# Patient Record
Sex: Female | Born: 1995 | Race: White | Hispanic: No | Marital: Married | Smoking: Former smoker
Health system: Southern US, Community
[De-identification: ages and names within clinical notes are randomized; demographics above are authoritative.]

## PROBLEM LIST (undated history)

## (undated) DIAGNOSIS — F419 Anxiety disorder, unspecified: Secondary | ICD-10-CM

## (undated) DIAGNOSIS — F329 Major depressive disorder, single episode, unspecified: Secondary | ICD-10-CM

## (undated) DIAGNOSIS — F32A Depression, unspecified: Secondary | ICD-10-CM

## (undated) HISTORY — PX: MYRINGOTOMY WITH TUBE PLACEMENT: SHX5663

## (undated) HISTORY — PX: TONSILLECTOMY: SUR1361

## (undated) HISTORY — PX: ADENOIDECTOMY: SUR15

---
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
## 2013-04-05 ENCOUNTER — Emergency Department: Payer: Self-pay | Admitting: Internal Medicine

## 2014-04-09 ENCOUNTER — Emergency Department: Payer: Self-pay | Admitting: Emergency Medicine

## 2014-07-06 ENCOUNTER — Emergency Department: Payer: Self-pay | Admitting: Emergency Medicine

## 2014-07-06 LAB — CBC
HCT: 45 % (ref 35.0–47.0)
HGB: 15.3 g/dL (ref 12.0–16.0)
MCH: 32.5 pg (ref 26.0–34.0)
MCHC: 34.1 g/dL (ref 32.0–36.0)
MCV: 96 fL (ref 80–100)
PLATELETS: 236 10*3/uL (ref 150–440)
RBC: 4.71 10*6/uL (ref 3.80–5.20)
RDW: 12.4 % (ref 11.5–14.5)
WBC: 8.8 10*3/uL (ref 3.6–11.0)

## 2014-07-06 LAB — COMPREHENSIVE METABOLIC PANEL
ALT: 29 U/L
AST: 28 U/L (ref 15–37)
Albumin: 4.6 g/dL (ref 3.4–5.0)
Alkaline Phosphatase: 62 U/L
Anion Gap: 10 (ref 7–16)
BUN: 9 mg/dL (ref 7–18)
Bilirubin,Total: 0.7 mg/dL (ref 0.2–1.0)
CALCIUM: 9.5 mg/dL (ref 8.5–10.1)
Chloride: 107 mmol/L (ref 98–107)
Co2: 23 mmol/L (ref 21–32)
Creatinine: 0.87 mg/dL (ref 0.60–1.30)
EGFR (African American): >60
EGFR (Non-African Amer.): >60
Glucose: 102 mg/dL — ABNORMAL HIGH (ref 65–99)
Osmolality: 278 (ref 275–301)
Potassium: 3.9 mmol/L (ref 3.5–5.1)
Sodium: 140 mmol/L (ref 136–145)
Total Protein: 8.2 g/dL (ref 6.4–8.2)

## 2014-07-06 LAB — URINALYSIS, COMPLETE
Bacteria: NONE SEEN
Bilirubin,UR: NEGATIVE
GLUCOSE, UR: NEGATIVE mg/dL (ref 0–75)
Nitrite: NEGATIVE
Ph: 6 (ref 4.5–8.0)
Protein: 30
RBC,UR: 10 /HPF (ref 0–5)
Specific Gravity: 1.028 (ref 1.003–1.030)
Squamous Epithelial: 11
WBC UR: 8 /HPF (ref 0–5)

## 2016-07-06 ENCOUNTER — Emergency Department
Admission: EM | Admit: 2016-07-06 | Discharge: 2016-07-06 | Disposition: A | Discharge status: Home / Self Care | Payer: Self-pay | Attending: Emergency Medicine | Admitting: Emergency Medicine

## 2016-07-06 ENCOUNTER — Encounter: Payer: Self-pay | Admitting: Emergency Medicine

## 2016-07-06 DIAGNOSIS — R112 Nausea with vomiting, unspecified: Secondary | ICD-10-CM | POA: Insufficient documentation

## 2016-07-06 DIAGNOSIS — Z79899 Other long term (current) drug therapy: Secondary | ICD-10-CM | POA: Insufficient documentation

## 2016-07-06 DIAGNOSIS — F17200 Nicotine dependence, unspecified, uncomplicated: Secondary | ICD-10-CM | POA: Insufficient documentation

## 2016-07-06 LAB — URINALYSIS, COMPLETE (UACMP) WITH MICROSCOPIC
BILIRUBIN URINE: NEGATIVE
Glucose, UA: NEGATIVE mg/dL
KETONES UR: 80 mg/dL — AB
Nitrite: NEGATIVE
Protein, ur: 30 mg/dL — AB
SPECIFIC GRAVITY, URINE: 1.029 (ref 1.005–1.030)
pH: 6 (ref 5.0–8.0)

## 2016-07-06 LAB — COMPREHENSIVE METABOLIC PANEL
ALT: 14 U/L (ref 14–54)
AST: 20 U/L (ref 15–41)
Albumin: 5.2 g/dL — ABNORMAL HIGH (ref 3.5–5.0)
Alkaline Phosphatase: 48 U/L (ref 38–126)
Anion gap: 10 (ref 5–15)
BUN: 15 mg/dL (ref 6–20)
CHLORIDE: 107 mmol/L (ref 101–111)
CO2: 21 mmol/L — AB (ref 22–32)
Calcium: 9.7 mg/dL (ref 8.9–10.3)
Creatinine, Ser: 0.95 mg/dL (ref 0.44–1.00)
Glucose, Bld: 101 mg/dL — ABNORMAL HIGH (ref 65–99)
Potassium: 3.6 mmol/L (ref 3.5–5.1)
SODIUM: 138 mmol/L (ref 135–145)
Total Bilirubin: 0.6 mg/dL (ref 0.3–1.2)
Total Protein: 8.2 g/dL — ABNORMAL HIGH (ref 6.5–8.1)

## 2016-07-06 LAB — CBC
HEMATOCRIT: 42.4 % (ref 35.0–47.0)
Hemoglobin: 14.5 g/dL (ref 12.0–16.0)
MCH: 32.3 pg (ref 26.0–34.0)
MCHC: 34.3 g/dL (ref 32.0–36.0)
MCV: 94.1 fL (ref 80.0–100.0)
PLATELETS: 275 10*3/uL (ref 150–440)
RBC: 4.5 MIL/uL (ref 3.80–5.20)
RDW: 12.5 % (ref 11.5–14.5)
WBC: 8.3 10*3/uL (ref 3.6–11.0)

## 2016-07-06 LAB — POCT PREGNANCY, URINE: PREG TEST UR: NEGATIVE

## 2016-07-06 LAB — LIPASE, BLOOD: LIPASE: 16 U/L (ref 11–51)

## 2016-07-06 MED ORDER — PROMETHAZINE HCL 25 MG/ML IJ SOLN
6.2500 mg | Freq: Once | INTRAMUSCULAR | Status: AC
  Administered 2016-07-06: 6.25 mg via INTRAVENOUS
  Filled 2016-07-06: qty 1

## 2016-07-06 MED ORDER — PROMETHAZINE HCL 12.5 MG RE SUPP: 12.5000 mg | Freq: Four times a day (QID) | RECTAL | 0 refills | Status: DC | PRN

## 2016-07-06 MED ORDER — SODIUM CHLORIDE 0.9 % IV BOLUS (SEPSIS)
1000.0000 mL | Freq: Once | INTRAVENOUS | Status: AC
  Administered 2016-07-06: 1000 mL via INTRAVENOUS

## 2016-07-06 MED ORDER — PROMETHAZINE HCL 12.5 MG PO TABS: 12.5000 mg | ORAL_TABLET | Freq: Four times a day (QID) | ORAL | 0 refills | Status: DC | PRN

## 2016-07-06 NOTE — ED Notes (Signed)
ED Provider at bedside. 

## 2016-07-06 NOTE — Discharge Instructions (Signed)
Please advance that as tolerated and drink plenty of fluids. Return here especially for persistent localized abdominal pain, fever, bloody stool.  Please return immediately if condition worsens. Please contact her primary physician or the physician you were given for referral. If you have any specialist physicians involved in her treatment and plan please also contact them. Thank you for using [facility].

## 2016-07-06 NOTE — ED Provider Notes (Signed)
Time Seen: Approximately 1256  I have reviewed the triage notes  Chief Complaint: Emesis   History of Present Illness: Sydney HeathSydney L Clark is a 20 y.o. female who presents with some persistent intermittent vomiting for the past week. She's had some small amount of loose stool with no melena or hematochezia. Patient states she has a history of anxiety and felt this may be an exacerbation because of it. The patient denies any melena or hematochezia. She does admit to daily marijuana usage. She states she's never had cyclic vomiting from marijuana in the past. She denies any focal abdominal pain. She denies any fever food exposure or any other concerns   No past medical history on file.  There are no active problems to display for this patient.   Past Surgical History:  Procedure Laterality Date  . ADENOIDECTOMY    . TONSILLECTOMY      Past Surgical History:  Procedure Laterality Date  . ADENOIDECTOMY    . TONSILLECTOMY      Current Outpatient Rx  . Order #: [id number] Class: Historical Med  . Order #: [id number] Class: Historical Med  . Order #: [id number] Class: Historical Med  . Order #: [id number] Class: Print  . Order #: [id number] Class: Print    Allergies:  Patient has no known allergies.  Family History: No family history on file.  Social History: Social History  Substance Use Topics  . Smoking status: Current Every Day Smoker  . Smokeless tobacco: Never Used  . Alcohol use No     Review of Systems:   10 point review of systems was performed and was otherwise negative:  Constitutional: No fever Eyes: No visual disturbances ENT: No sore throat, ear pain Cardiac: No chest pain Respiratory: No shortness of breath, wheezing, or stridor Abdomen: No abdominal pain, n, NoSignificant diarrhea with just loose stoolEndocrine: No weight loss, No night sweats Extremities: No peripheral edema, cyanosis Skin: No rashes, easy bruising Neurologic: No focal weakness, trouble  with speech or swollowing Urologic: No dysuria, Hematuria, or urinary frequency   Physical Exam:  ED Triage Vitals [07/06/16 1237]  Enc Vitals Group     BP 129/80     Pulse Rate (!) 104     Resp 18     Temp 98.7 F (37.1 C)     Temp Source Oral     SpO2 100 %     Weight 171 lb (77.6 kg)     Height 5\' 6"  (1.676 m)     Head Circumference      Peak Flow      Pain Score      Pain Loc      Pain Edu?      Excl. in GC?     General: Awake , Alert , and Oriented times 3; GCS 15 Head: Normal cephalic , atraumatic Eyes: Pupils equal , round, reactive to light Nose/Throat: No nasal drainage, patent upper airway without erythema or exudate.  Neck: Supple, Full range of motion, No anterior adenopathy or palpable thyroid masses Lungs: Clear to ascultation without wheezes , rhonchi, or rales Heart: Regular rate, regular rhythm without murmurs , gallops , or rubs Abdomen: Soft, non tender without rebound, guarding , or rigidity; bowel sounds positive and symmetric in all 4 quadrants. No organomegaly .        Extremities: 2 plus symmetric pulses. No edema, clubbing or cyanosis Neurologic: normal ambulation, Motor symmetric without deficits, sensory intact Skin: warm, dry, no rashes   Labs:   All laboratory work  was reviewed including any pertinent negatives or positives listed below:  Labs Reviewed  COMPREHENSIVE METABOLIC PANEL - Abnormal; Notable for the following:       Result Value   CO2 21 (*)    Glucose, Bld 101 (*)    Total Protein 8.2 (*)    Albumin 5.2 (*)    All other components within normal limits  URINALYSIS, COMPLETE (UACMP) WITH MICROSCOPIC - Abnormal; Notable for the following:    Color, Urine YELLOW (*)    APPearance HAZY (*)    Hgb urine dipstick SMALL (*)    Ketones, ur 80 (*)    Protein, ur 30 (*)    Leukocytes, UA SMALL (*)    Bacteria, UA RARE (*)    Squamous Epithelial / LPF 6-30 (*)    All other components within normal limits  LIPASE, BLOOD  CBC   POC URINE PREG, ED  POCT PREGNANCY, URINE  Patient has concentrated urine but otherwise no significant findings   ED Course: * Patient had an IV established and was given an IV fluid bolus with IV Phenergan. She was able to maintain at a fluid and food here in emergency department. I felt she may have a viral gastroenteritis or possibly marijuana-induced cyclic vomiting. Patient appears to be of understanding and does not present with any abdominal pain and I felt this was unlikely to be a surgical abdomen. Clinical Course      Assessment: * Nausea and vomiting   Final Clinical Impression:  Final diagnoses:  Non-intractable vomiting with nausea, unspecified vomiting type     Plan:  Outpatient " Discharge Medication List as of 07/06/2016  2:44 PM    START taking these medications   Details  promethazine (PHENERGAN) 12.5 MG suppository Place 1 suppository (12.5 mg total) rectally every 6 (six) hours as needed for nausea or vomiting., Starting Sun 07/06/2016, Print    promethazine (PHENERGAN) 12.5 MG tablet Take 1 tablet (12.5 mg total) by mouth every 6 (six) hours as needed for nausea or vomiting., Starting Sun 07/06/2016, Print      " Patient was advised to return immediately if condition worsens. Patient was advised to follow up with their primary care physician or other specialized physicians involved in their outpatient care. The patient and/or family member/power of attorney had laboratory results reviewed at the bedside. All questions and concerns were addressed and appropriate discharge instructions were distributed by the nursing staff.             Jennye MoccasinBrian S Cash Duce, MD 07/06/16 [phone]

## 2016-07-06 NOTE — ED Triage Notes (Signed)
Pt c/o vomiting over last week. Stated Monday when was planning to move but started having anxiety so has not moved yet but has still had vomiting and reports unable to keep food/liquids down. Denies blood in vomit/stool. Denies fevers.

## 2016-07-26 ENCOUNTER — Encounter: Payer: Self-pay | Admitting: Emergency Medicine

## 2016-07-26 ENCOUNTER — Emergency Department
Admission: EM | Admit: 2016-07-26 | Discharge: 2016-07-26 | Disposition: A | Discharge status: Home / Self Care | Payer: Self-pay | Attending: Emergency Medicine | Admitting: Emergency Medicine

## 2016-07-26 DIAGNOSIS — F17210 Nicotine dependence, cigarettes, uncomplicated: Secondary | ICD-10-CM | POA: Insufficient documentation

## 2016-07-26 DIAGNOSIS — Y929 Unspecified place or not applicable: Secondary | ICD-10-CM | POA: Insufficient documentation

## 2016-07-26 DIAGNOSIS — X58XXXA Exposure to other specified factors, initial encounter: Secondary | ICD-10-CM | POA: Insufficient documentation

## 2016-07-26 DIAGNOSIS — Y939 Activity, unspecified: Secondary | ICD-10-CM | POA: Insufficient documentation

## 2016-07-26 DIAGNOSIS — S39012A Strain of muscle, fascia and tendon of lower back, initial encounter: Secondary | ICD-10-CM | POA: Insufficient documentation

## 2016-07-26 DIAGNOSIS — Y999 Unspecified external cause status: Secondary | ICD-10-CM | POA: Insufficient documentation

## 2016-07-26 MED ORDER — DIAZEPAM 5 MG PO TABS
5.0000 mg | ORAL_TABLET | Freq: Once | ORAL | Status: AC
  Administered 2016-07-26: 5 mg via ORAL
  Filled 2016-07-26: qty 1

## 2016-07-26 NOTE — ED Triage Notes (Addendum)
Patient ambulatory to triage. Patient states that she was coughing and felt something "pop".  Patient states that since then she has had left lower back pain and right leg heaviness. Patient states that it started in the afternoon but the pain has become worse.

## 2016-07-26 NOTE — ED Notes (Signed)
Dr Kinner at bedside. 

## 2016-07-26 NOTE — ED Notes (Signed)
Pt verbalized understanding of discharge instructions. NAD at this time. 

## 2016-07-26 NOTE — ED Provider Notes (Signed)
[facility] Emergency Department Provider Note   ____________________________________________    I have reviewed the triage vital signs and the nursing notes.   HISTORY  Chief Complaint Back Pain     HPI Sydney Clark is a 21 y.o. female who presents with complaints of low back pain. Patient reports yesterday at 1 PM she was coughing and suddenly felt a pain in her back. She denies neuro deficits. She denies radiation of pain. No IV drug abuse. She reports the pain is sharp and it is painful to flex and move. She is able to walk but it is painful. She is concerned that she may have slipped a disc   History reviewed. No pertinent past medical history.  There are no active problems to display for this patient.   Past Surgical History:  Procedure Laterality Date  . ADENOIDECTOMY    . TONSILLECTOMY      Prior to Admission medications   Medication Sig Start Date End Date Taking? Authorizing Provider  busPIRone (BUSPAR) 10 MG tablet Take 20 mg by mouth 2 (two) times daily.   Yes Historical Provider, MD  lamoTRIgine (LAMICTAL) 100 MG tablet Take 100 mg by mouth daily.   Yes Historical Provider, MD  PARoxetine (PAXIL) 20 MG tablet Take 20 mg by mouth daily.   Yes Historical Provider, MD  promethazine (PHENERGAN) 12.5 MG suppository Place 1 suppository (12.5 mg total) rectally every 6 (six) hours as needed for nausea or vomiting. 07/06/16   Jennye MoccasinBrian S Quigley, MD  promethazine (PHENERGAN) 12.5 MG tablet Take 1 tablet (12.5 mg total) by mouth every 6 (six) hours as needed for nausea or vomiting. 07/06/16   Jennye MoccasinBrian S Quigley, MD     Allergies Patient has no known allergies.  No family history on file.  Social History Social History  Substance Use Topics  . Smoking status: Current Every Day Smoker  . Smokeless tobacco: Never Used  . Alcohol use No    Review of Systems  Constitutional: No fever/chills     Gastrointestinal: No abdominal pain.   No nausea, no vomiting.   Genitourinary: Negative forUrinary incontinence Musculoskeletal: As above Skin: Negative for rash. Neurological: Negative for focal deficits    ____________________________________________   PHYSICAL EXAM:  VITAL SIGNS: ED Triage Vitals  Enc Vitals Group     BP 07/26/16 0500 113/68     Pulse Rate 07/26/16 0500 (!) 101     Resp 07/26/16 0500 18     Temp 07/26/16 0500 97.8 F (36.6 C)     Temp Source 07/26/16 0500 Oral     SpO2 07/26/16 0500 98 %     Weight 07/26/16 0457 171 lb (77.6 kg)     Height 07/26/16 0457 5\' 6"  (1.676 m)     Head Circumference --      Peak Flow --      Pain Score 07/26/16 0457 8     Pain Loc --      Pain Edu? --      Excl. in GC? --     Constitutional: Alert and oriented. No acute distress. Pleasant and interactive Eyes: Conjunctivae are normal.   Nose: No congestion/rhinnorhea. Mouth/Throat: Mucous membranes are moist.   Cardiovascular: Normal rate, regular rhythm.  Respiratory: Normal respiratory effort.  No retractions. Genitourinary: deferred Musculoskeletal:Normal strength in the lower extremities. 2+ distal pulses. Back no vertebral tenderness to palpation. No palpable muscle spasms Neurologic:  Normal speech and language. No gross focal neurologic deficits are  appreciated.   Skin:  Skin is warm, dry and intact. No rash noted.   ____________________________________________   LABS (all labs ordered are listed, but only abnormal results are displayed)  Labs Reviewed - No data to display ____________________________________________  EKG   ____________________________________________  RADIOLOGY  None ____________________________________________   PROCEDURES  Procedure(s) performed: No    Critical Care performed: No ____________________________________________   INITIAL IMPRESSION / ASSESSMENT AND PLAN / ED COURSE  Pertinent labs & imaging results that were available during my care of the  patient were reviewed by me and considered in my medical decision making (see chart for details).  She presents with low back pain. Suspect lumbar strain. No concerning symptoms Recommend supportive care. Outpatient follow-up as needed we discussed return precautions.   ____________________________________________   FINAL CLINICAL IMPRESSION(S) / ED DIAGNOSES  Final diagnoses:  Strain of lumbar region, initial encounter      NEW MEDICATIONS STARTED DURING THIS VISIT:  New Prescriptions   No medications on file     Note:  This document was prepared using Dragon voice recognition software and may include unintentional dictation errors.    [name], MD 07/26/16 [phone]

## 2016-08-16 ENCOUNTER — Emergency Department
Admission: EM | Admit: 2016-08-16 | Discharge: 2016-08-16 | Disposition: A | Discharge status: Home / Self Care | Payer: Self-pay | Attending: Emergency Medicine | Admitting: Emergency Medicine

## 2016-08-16 ENCOUNTER — Encounter: Payer: Self-pay | Admitting: Emergency Medicine

## 2016-08-16 DIAGNOSIS — F1290 Cannabis use, unspecified, uncomplicated: Secondary | ICD-10-CM | POA: Insufficient documentation

## 2016-08-16 DIAGNOSIS — Z5321 Procedure and treatment not carried out due to patient leaving prior to being seen by health care provider: Secondary | ICD-10-CM | POA: Insufficient documentation

## 2016-08-16 DIAGNOSIS — R05 Cough: Secondary | ICD-10-CM | POA: Insufficient documentation

## 2016-08-16 DIAGNOSIS — F17210 Nicotine dependence, cigarettes, uncomplicated: Secondary | ICD-10-CM | POA: Insufficient documentation

## 2016-08-16 DIAGNOSIS — Z79899 Other long term (current) drug therapy: Secondary | ICD-10-CM | POA: Insufficient documentation

## 2016-08-16 DIAGNOSIS — R197 Diarrhea, unspecified: Secondary | ICD-10-CM | POA: Insufficient documentation

## 2016-08-16 HISTORY — DX: Major depressive disorder, single episode, unspecified: F32.9

## 2016-08-16 HISTORY — DX: Depression, unspecified: F32.A

## 2016-08-16 HISTORY — DX: Anxiety disorder, unspecified: F41.9

## 2016-08-16 LAB — COMPREHENSIVE METABOLIC PANEL
ALT: 23 U/L (ref 14–54)
AST: 24 U/L (ref 15–41)
Albumin: 5.1 g/dL — ABNORMAL HIGH (ref 3.5–5.0)
Alkaline Phosphatase: 52 U/L (ref 38–126)
Anion gap: 9 (ref 5–15)
BUN: 13 mg/dL (ref 6–20)
CHLORIDE: 106 mmol/L (ref 101–111)
CO2: 22 mmol/L (ref 22–32)
CREATININE: 0.77 mg/dL (ref 0.44–1.00)
Calcium: 9.4 mg/dL (ref 8.9–10.3)
GFR calc Af Amer: >60 mL/min (ref >60)
GFR calc non Af Amer: >60 mL/min (ref >60)
Glucose, Bld: 92 mg/dL (ref 65–99)
POTASSIUM: 3.7 mmol/L (ref 3.5–5.1)
SODIUM: 137 mmol/L (ref 135–145)
Total Bilirubin: 0.6 mg/dL (ref 0.3–1.2)
Total Protein: 8.3 g/dL — ABNORMAL HIGH (ref 6.5–8.1)

## 2016-08-16 LAB — URINALYSIS, COMPLETE (UACMP) WITH MICROSCOPIC
BILIRUBIN URINE: NEGATIVE
Glucose, UA: NEGATIVE mg/dL
Hgb urine dipstick: NEGATIVE
KETONES UR: 20 mg/dL — AB
LEUKOCYTES UA: NEGATIVE
Nitrite: NEGATIVE
PH: 6 (ref 5.0–8.0)
Protein, ur: 30 mg/dL — AB
Specific Gravity, Urine: 1.027 (ref 1.005–1.030)

## 2016-08-16 LAB — POCT PREGNANCY, URINE: PREG TEST UR: NEGATIVE

## 2016-08-16 LAB — LIPASE, BLOOD: LIPASE: 21 U/L (ref 11–51)

## 2016-08-16 LAB — CBC
HEMATOCRIT: 40.1 % (ref 35.0–47.0)
Hemoglobin: 13.9 g/dL (ref 12.0–16.0)
MCH: 31.6 pg (ref 26.0–34.0)
MCHC: 34.6 g/dL (ref 32.0–36.0)
MCV: 91.2 fL (ref 80.0–100.0)
PLATELETS: 241 10*3/uL (ref 150–440)
RBC: 4.39 MIL/uL (ref 3.80–5.20)
RDW: 13.5 % (ref 11.5–14.5)
WBC: 7.3 10*3/uL (ref 3.6–11.0)

## 2016-08-16 NOTE — ED Triage Notes (Addendum)
Pt reports watery diarrhea since Wednesday; denies abd pain; pt adds she cries a lot but not sure why; on medication for depression but doesn't currently feel depressed; denies suicidal thoughts; pt says she believes her anxiety is causing her diarrhea

## 2017-09-27 ENCOUNTER — Emergency Department
Admission: EM | Admit: 2017-09-27 | Discharge: 2017-09-27 | Disposition: A | Discharge status: Home / Self Care | Payer: Self-pay | Attending: Emergency Medicine | Admitting: Emergency Medicine

## 2017-09-27 ENCOUNTER — Emergency Department: Payer: Self-pay

## 2017-09-27 ENCOUNTER — Other Ambulatory Visit: Payer: Self-pay

## 2017-09-27 DIAGNOSIS — Y929 Unspecified place or not applicable: Secondary | ICD-10-CM | POA: Insufficient documentation

## 2017-09-27 DIAGNOSIS — Y999 Unspecified external cause status: Secondary | ICD-10-CM | POA: Insufficient documentation

## 2017-09-27 DIAGNOSIS — S62306A Unspecified fracture of fifth metacarpal bone, right hand, initial encounter for closed fracture: Secondary | ICD-10-CM | POA: Insufficient documentation

## 2017-09-27 DIAGNOSIS — Y9389 Activity, other specified: Secondary | ICD-10-CM | POA: Insufficient documentation

## 2017-09-27 DIAGNOSIS — X838XXA Intentional self-harm by other specified means, initial encounter: Secondary | ICD-10-CM | POA: Insufficient documentation

## 2017-09-27 DIAGNOSIS — F17210 Nicotine dependence, cigarettes, uncomplicated: Secondary | ICD-10-CM | POA: Insufficient documentation

## 2017-09-27 DIAGNOSIS — Z79899 Other long term (current) drug therapy: Secondary | ICD-10-CM | POA: Insufficient documentation

## 2017-09-27 MED ORDER — MELOXICAM 15 MG PO TABS: 15.0000 mg | ORAL_TABLET | Freq: Every day | ORAL | 1 refills | Status: AC

## 2017-09-27 NOTE — ED Triage Notes (Signed)
Pt states she injured her right hand 2 weeks ago and is concerned it is fractured..Marland Kitchen

## 2017-09-27 NOTE — ED Provider Notes (Signed)
[facility] Emergency Department Provider Note  ____________________________________________  Time seen: Approximately 5:09 PM  I have reviewed the triage vital signs and the nursing notes.   HISTORY  Chief Complaint Hand Pain    HPI Sydney Clark is a 22 y.o. female presents to the emergency department with right fifth metacarpal pain for the past 2 weeks after patient reports that she intentionally struck a 2 x 4.  Patient reports that she did not seek care due to a lack of insurance.  Patient reports that pain has subsided and her range of motion has improved and she denies weakness or changes in sensation of the upper extremities.   Past Medical History:  Diagnosis Date  . Anxiety   . Depression     There are no active problems to display for this patient.   Past Surgical History:  Procedure Laterality Date  . ADENOIDECTOMY    . MYRINGOTOMY WITH TUBE PLACEMENT    . TONSILLECTOMY      Prior to Admission medications   Medication Sig Start Date End Date Taking? Authorizing Provider  busPIRone (BUSPAR) 10 MG tablet Take 20 mg by mouth 2 (two) times daily.    [provider]  lamoTRIgine (LAMICTAL) 100 MG tablet Take 100 mg by mouth daily.    [provider]  meloxicam (MOBIC) 15 MG tablet Take 1 tablet (15 mg total) by mouth daily for 7 days. 09/27/17 10/04/17  Orvil FeilWoods, Terell Kincy M, PA-C  PARoxetine (PAXIL) 20 MG tablet Take 20 mg by mouth daily.    [provider]  promethazine (PHENERGAN) 12.5 MG suppository Place 1 suppository (12.5 mg total) rectally every 6 (six) hours as needed for nausea or vomiting. 07/06/16   Jennye MoccasinQuigley, Brian S, MD  promethazine (PHENERGAN) 12.5 MG tablet Take 1 tablet (12.5 mg total) by mouth every 6 (six) hours as needed for nausea or vomiting. 07/06/16   Jennye MoccasinQuigley, Brian S, MD    Allergies Patient has no known allergies.  No family history on file.  Social History Social History   Tobacco Use  .  Smoking status: Current Every Day Smoker    Packs/day: 0.50    Types: Cigarettes  . Smokeless tobacco: Never Used  Substance Use Topics  . Alcohol use: No  . Drug use: Yes    Types: Marijuana    Comment: last smoked this am     Review of Systems  Constitutional: No fever/chills Eyes: No visual changes. No discharge ENT: No upper respiratory complaints. Cardiovascular: no chest pain. Respiratory: no cough. No SOB. Musculoskeletal: Patient has right fifth digit pain.  Skin: Negative for rash, abrasions, lacerations, ecchymosis. Neurological: Negative for headaches, focal weakness or numbness.   ____________________________________________   PHYSICAL EXAM:  VITAL SIGNS: ED Triage Vitals  Enc Vitals Group     BP 09/27/17 1330 (!) 104/58     Pulse Rate 09/27/17 1330 74     Resp 09/27/17 1330 16     Temp 09/27/17 1330 98.7 F (37.1 C)     Temp Source 09/27/17 1330 Oral     SpO2 09/27/17 1330 98 %     Weight 09/27/17 1330 170 lb (77.1 kg)     Height 09/27/17 1330 5\' 6"  (1.676 m)     Head Circumference --      Peak Flow --      Pain Score 09/27/17 1314 6     Pain Loc --      Pain Edu? --  Excl. in GC? --      Constitutional: Alert and oriented. Well appearing and in no acute distress. Eyes: Conjunctivae are normal. PERRL. EOMI. Head: Atraumatic. Cardiovascular: Normal rate, regular rhythm. Normal S1 and S2.  Good peripheral circulation. Respiratory: Normal respiratory effort without tachypnea or retractions. Lungs CTAB. Good air entry to the bases with no decreased or absent breath sounds. Musculoskeletal: Patient is able to move all 5 right fingers.  Patient has tenderness elicited with palpation over the right fifth metacarpal.  Palpable radial pulse, right. Neurologic:  Normal speech and language. No gross focal neurologic deficits are appreciated.  Skin:  Skin is warm, dry and intact. No rash noted.   ____________________________________________    LABS (all labs ordered are listed, but only abnormal results are displayed)  Labs Reviewed - No data to display ____________________________________________  EKG   ____________________________________________  RADIOLOGY Geraldo Pitter, personally viewed and evaluated these images (plain radiographs) as part of my medical decision making, as well as reviewing the written report by the radiologist.  Dg Hand Complete Right  Result Date: 09/27/2017 CLINICAL DATA:  Pt states she punched a wall 2 weeks ago pain and bruising 5th metacarpal. shielded EXAM: RIGHT HAND - COMPLETE 3+ VIEW COMPARISON:  None. FINDINGS: Displaced fracture within the distal fifth metacarpal bone, with associated angulation deformity. No other osseous fracture or dislocation. Soft tissues about the right hand are unremarkable. IMPRESSION: Displaced fracture within the distal portion of the fifth metacarpal bone, centered at the base of the metacarpal head, with associated angulation deformity approaching 60 degrees. Alignment at the overlying fifth MCP joint remains normal. Electronically Signed   By: Bary Richard M.D.   On: 09/27/2017 13:58    ____________________________________________    PROCEDURES  Procedure(s) performed:    Procedures    Medications - No data to display   ____________________________________________   INITIAL IMPRESSION / ASSESSMENT AND PLAN / ED COURSE  Pertinent labs & imaging results that were available during my care of the patient were reviewed by me and considered in my medical decision making (see chart for details).  Review of the [place] CSRS was performed in accordance of the [facility] prior to dispensing any controlled drugs.     Assessment and Plan: Boxer's fracture Patient presents to the emergency department with right fifth digit pain.  Differential diagnosis included fracture, contusion and flexor tendon injury.  X-ray examination of the right hand was concerning  for a displaced right fifth metacarpal fracture.  Patient was advised that a reduction would not be attempted in the emergency department due to the duration of time since injury.  Patient was referred to orthopedics and discharged with meloxicam.  Vital signs were reassuring prior to discharge.    ____________________________________________  FINAL CLINICAL IMPRESSION(S) / ED DIAGNOSES  Final diagnoses:  Closed displaced fracture of fifth metacarpal bone of right hand, unspecified portion of metacarpal, initial encounter      NEW MEDICATIONS STARTED DURING THIS VISIT:  ED Discharge Orders        Ordered    meloxicam (MOBIC) 15 MG tablet  Daily     09/27/17 1551          This chart was dictated using voice recognition software/Dragon. Despite best efforts to proofread, errors can occur which can change the meaning. Any change was purely unintentional.    Orvil Feil, PA-C 09/27/17 1716    Governor Rooks, MD 09/30/17 [phone]

## 2017-09-27 NOTE — ED Notes (Signed)
See triage note  Presents with pain and swelling noted to right hand  States she punched something about 2 weeks ago

## 2018-05-05 ENCOUNTER — Emergency Department
Admission: EM | Admit: 2018-05-05 | Discharge: 2018-05-05 | Disposition: A | Discharge status: Home / Self Care | Payer: Self-pay | Attending: Emergency Medicine | Admitting: Emergency Medicine

## 2018-05-05 ENCOUNTER — Encounter: Payer: Self-pay | Admitting: Emergency Medicine

## 2018-05-05 ENCOUNTER — Emergency Department: Payer: Self-pay

## 2018-05-05 DIAGNOSIS — J40 Bronchitis, not specified as acute or chronic: Secondary | ICD-10-CM | POA: Insufficient documentation

## 2018-05-05 DIAGNOSIS — Z87891 Personal history of nicotine dependence: Secondary | ICD-10-CM | POA: Insufficient documentation

## 2018-05-05 DIAGNOSIS — Z79899 Other long term (current) drug therapy: Secondary | ICD-10-CM | POA: Insufficient documentation

## 2018-05-05 MED ORDER — PREDNISONE 20 MG PO TABS
40.0000 mg | ORAL_TABLET | Freq: Once | ORAL | Status: AC
  Administered 2018-05-05: 40 mg via ORAL
  Filled 2018-05-05: qty 2

## 2018-05-05 MED ORDER — PREDNISONE 20 MG PO TABS: 40.0000 mg | ORAL_TABLET | Freq: Every day | ORAL | 0 refills | Status: AC

## 2018-05-05 MED ORDER — AZITHROMYCIN 500 MG PO TABS
500.0000 mg | ORAL_TABLET | Freq: Once | ORAL | Status: AC
  Administered 2018-05-05: 500 mg via ORAL
  Filled 2018-05-05: qty 1

## 2018-05-05 MED ORDER — ALBUTEROL SULFATE HFA 108 (90 BASE) MCG/ACT IN AERS: 2.0000 | INHALATION_SPRAY | Freq: Four times a day (QID) | RESPIRATORY_TRACT | 0 refills | Status: DC | PRN

## 2018-05-05 MED ORDER — ALBUTEROL SULFATE (2.5 MG/3ML) 0.083% IN NEBU
2.5000 mg | INHALATION_SOLUTION | Freq: Once | RESPIRATORY_TRACT | Status: AC
  Administered 2018-05-05: 2.5 mg via RESPIRATORY_TRACT
  Filled 2018-05-05: qty 3

## 2018-05-05 MED ORDER — AZITHROMYCIN 250 MG PO TABS: ORAL_TABLET | ORAL | 0 refills | Status: AC

## 2018-05-05 NOTE — ED Provider Notes (Signed)
[facility] Emergency Department Provider Note  ___________________________________________   First MD Initiated Contact with Patient 05/05/18 1435     (approximate)  I have reviewed the triage vital signs and the nursing notes.   HISTORY  Chief Complaint Shortness of Breath   HPI Sydney Clark is a 22 y.o. female with a history of anxiety was presented to the emergency room today with 3 weeks of worsening shortness of breath.  She says that she is also had nasal congestion over the past week.  Denies any chest pain.  Denies any productive cough.  Says that she has been quitting smoking with her last cigarette being 1 week ago.  No history of bronchitis or asthma.  Does not report any lengthy recent trips.  No leg swelling.  Patient denies being on any hormone supplements or birth control.  Past Medical History:  Diagnosis Date  . Anxiety   . Depression     There are no active problems to display for this patient.   Past Surgical History:  Procedure Laterality Date  . ADENOIDECTOMY    . MYRINGOTOMY WITH TUBE PLACEMENT    . TONSILLECTOMY      Prior to Admission medications   Medication Sig Start Date End Date Taking? Authorizing Provider  busPIRone (BUSPAR) 10 MG tablet Take 20 mg by mouth 2 (two) times daily.    [provider]  lamoTRIgine (LAMICTAL) 100 MG tablet Take 100 mg by mouth daily.    [provider]  PARoxetine (PAXIL) 20 MG tablet Take 20 mg by mouth daily.    [provider]  promethazine (PHENERGAN) 12.5 MG suppository Place 1 suppository (12.5 mg total) rectally every 6 (six) hours as needed for nausea or vomiting. 07/06/16   Jennye Moccasin, MD  promethazine (PHENERGAN) 12.5 MG tablet Take 1 tablet (12.5 mg total) by mouth every 6 (six) hours as needed for nausea or vomiting. 07/06/16   Jennye Moccasin, MD    Allergies Patient has no known allergies.  No family history on file.  Social  History Social History   Tobacco Use  . Smoking status: Former Smoker    Packs/day: 0.50    Types: Cigarettes    Last attempt to quit: 04/28/2018    Years since quitting: 0.0  . Smokeless tobacco: Never Used  Substance Use Topics  . Alcohol use: No  . Drug use: Yes    Types: Marijuana    Comment: last smoked this am    Review of Systems  Constitutional: No fever/chills Eyes: No visual changes. ENT: No sore throat. Cardiovascular: Denies chest pain. Respiratory: As above Gastrointestinal: No abdominal pain.  No nausea, no vomiting.  No diarrhea.  No constipation. Genitourinary: Negative for dysuria. Musculoskeletal: Negative for back pain. Skin: Negative for rash. Neurological: Negative for headaches, focal weakness or numbness.   ____________________________________________   PHYSICAL EXAM:  VITAL SIGNS: ED Triage Vitals [05/05/18 1420]  Enc Vitals Group     BP 137/83     Pulse Rate 88     Resp 18     Temp 98.3 F (36.8 C)     Temp Source Oral     SpO2 100 %     Weight 175 lb (79.4 kg)     Height 5\' 6"  (1.676 m)     Head Circumference      Peak Flow      Pain Score 0     Pain Loc  Pain Edu?      Excl. in GC?     Constitutional: Alert and oriented. Well appearing and in no acute distress. Eyes: Conjunctivae are normal.  Head: Atraumatic. Nose: No congestion/rhinnorhea. Mouth/Throat: Mucous membranes are moist.  Neck: No stridor.   Cardiovascular: Normal rate, regular rhythm. Grossly normal heart sounds Respiratory: Normal respiratory effort.  No retractions. Lungs CTAB.  Prolonged respiratory phase. Gastrointestinal: Soft and nontender. No distention. No CVA tenderness. Musculoskeletal: No lower extremity tenderness nor edema.  No joint effusions. Neurologic:  Normal speech and language. No gross focal neurologic deficits are appreciated. Skin:  Skin is warm, dry and intact. No rash noted. Psychiatric: Mood and affect are normal. Speech and  behavior are normal.  ____________________________________________   LABS (all labs ordered are listed, but only abnormal results are displayed)  Labs Reviewed - No data to display ____________________________________________  EKG  ED ECG REPORT I, Arelia Longest, the attending physician, personally viewed and interpreted this ECG.   Date: 05/05/2018  EKG Time: 1422  Rate: 98  Rhythm: normal sinus rhythm  Axis: Normal  Intervals:none  ST&T Change: No ST segment elevation or depression.  No abnormal T wave inversion.  ____________________________________________  RADIOLOGY  Ngetich changes on the chest x-ray ____________________________________________   PROCEDURES  Procedure(s) performed:   Procedures  Critical Care performed:   ____________________________________________   INITIAL IMPRESSION / ASSESSMENT AND PLAN / ED COURSE  Pertinent labs & imaging results that were available during my care of the patient were reviewed by me and considered in my medical decision making (see chart for details).  Differential includes, but is not limited to, viral syndrome, bronchitis including COPD exacerbation, pneumonia, reactive airway disease including asthma, CHF including exacerbation with or without pulmonary/interstitial edema, pneumothorax, ACS, thoracic trauma, and pulmonary embolism. As part of my medical decision making, I reviewed the following data within the electronic MEDICAL RECORD NUMBER Notes from prior ED visits  ----------------------------------------- 4:39 PM on 05/05/2018 -----------------------------------------  Patient states that this time that her breathing is improved and she is "coughing up brown stuff."  No coughing in the room.  Also looked in the nares and there is no bleeding, bilaterally.  Pharynx without any blood in the pharynx as well.  Patient's lungs reevaluated and patient moving more air at this time without a prolonged expiratory  phase.  Will be discharged with medication for bronchitis and will add azithromycin secondary to coughing up brown expectorant.  Patient understanding the diagnosis as well as treatment plan willing to comply.  To return for any worsening or concerning symptoms. ____________________________________________   FINAL CLINICAL IMPRESSION(S) / ED DIAGNOSES  Bronchitis  NEW MEDICATIONS STARTED DURING THIS VISIT:  New Prescriptions   No medications on file     Note:  This document was prepared using Dragon voice recognition software and may include unintentional dictation errors.     Myrna Blazer, MD 05/05/18 1640

## 2018-05-05 NOTE — ED Triage Notes (Signed)
Patient presents to the ED with shortness of breath.  Patient states, "I recently quit smoking because I was feeling short of breath about 3 weeks ago, but it hasn't gotten any better and I haven't had a cigarette in at least a week." Patient states she is having more difficulty breathing through her nose than her mouth.  Patient denies pain with breathing.  Patient states it is not worse with walking, somewhat worse when she lies down to go to sleep.

## 2018-05-05 NOTE — ED Notes (Signed)
Pt c/o feeling like she cant get a full breath for the past 2 weeks, states she stopped smoking a week ago and is still having the SOB. Denies cough or congestion, denies injury. Pt is able to speak in complete sentences without any difficulty.

## 2018-09-20 ENCOUNTER — Other Ambulatory Visit: Payer: Self-pay

## 2018-09-20 ENCOUNTER — Ambulatory Visit: Payer: Self-pay | Admitting: Nurse Practitioner

## 2018-09-20 ENCOUNTER — Encounter: Payer: Self-pay | Admitting: Nurse Practitioner

## 2018-09-20 VITALS — BP 125/69 | HR 85 | Temp 98.5°F | Ht 65.5 in | Wt 184.0 lb

## 2018-09-20 DIAGNOSIS — R197 Diarrhea, unspecified: Secondary | ICD-10-CM

## 2018-09-20 DIAGNOSIS — K219 Gastro-esophageal reflux disease without esophagitis: Secondary | ICD-10-CM

## 2018-09-20 DIAGNOSIS — R110 Nausea: Secondary | ICD-10-CM

## 2018-09-20 DIAGNOSIS — Z7689 Persons encountering health services in other specified circumstances: Secondary | ICD-10-CM

## 2018-09-20 DIAGNOSIS — F419 Anxiety disorder, unspecified: Secondary | ICD-10-CM

## 2018-09-20 MED ORDER — HYDROXYZINE HCL 10 MG PO TABS: 10.0000 mg | ORAL_TABLET | Freq: Three times a day (TID) | ORAL | 1 refills | Status: DC | PRN

## 2018-09-20 MED ORDER — OMEPRAZOLE 20 MG PO CPDR: 20.0000 mg | DELAYED_RELEASE_CAPSULE | Freq: Every day | ORAL | 1 refills | Status: DC

## 2018-09-20 NOTE — Patient Instructions (Addendum)
Sydney Clark,   Thank you for coming in to clinic today.  1. START hydroxyzine 10-20 mg one-two tabs up to three times daily as needed for anxiety and panic. - Avoid dramamine with this medication.  2. Ask [name] about medicine that may help with physical symptoms.  3. Continue Prilosec. - Can consider adding a medicine for IBS if not improving. - Can also take Immodium AD over the counter as needed up to 3 times per week.  Please schedule a follow-up appointment with [name], AGNP. Return in about 4 weeks (around 10/18/2018) for GI upset and breast exam.  If you have any other questions or concerns, please feel free to call the clinic or send a message through MyChart. You may also schedule an earlier appointment if necessary.  You will receive a survey after today's visit either digitally by e-mail or paper by [employer]. Your experiences and feedback matter to [place].  Please respond so we know how we are doing as we provide care for you.   [name], DNP, AGNP-BC Adult Gerontology Nurse Practitioner [facility]

## 2018-09-20 NOTE — Progress Notes (Signed)
Subjective:    Patient ID: Sydney Clark, female    DOB: [birth year], 23 y.o.   MRN: [id number]  Sydney Clark is a 23 y.o. female presenting on 09/20/2018 for Establish Care (pt notice in the winter month she notice shes more nausea, upset stomach. Pt unsure if this could be related to her anxiety/ depression.)   HPI Establish Care New Provider Pt last seen by PCP many years ago.   Anxiety [employer] -anxiety and depression - New therapist seems more helpful.  Diarrhea started around end of 2015.  This improved some after treatment for anxiety and depression. - Continues having physical GI symptoms in early morning.  Very loose BM, no abdominal cramping regularly with BM.  Symptoms include nausea, upset stomach. - Celexa - irritable - Paxil - worked  - STARTED Prozac - Had side effects with Remeron - Patient likes staying at home with anxiety.  Has not worked in 4 years due to physical sickness when waking up in the morning. - Grandmother's death was precipitating event for start of worsening symptoms.  Nausea  Other contributors: - Marijuana use - patient admits to self medicating.  Started using in 2014, current use is less than in past.  Patient smokes 1/2g per day with use three times daily  Patient feels use of marijuana improves her anxiety. Other factors: - Patient occasionally has some heartburn symptoms.  STARTED on Prilosec with mild improvement of sore throat.  Patient has taken for about 28 days.  No significant relief.  Not helped with nausea, but does feel better.  Past Medical History:  Diagnosis Date  . Anxiety   . Depression    Past Surgical History:  Procedure Laterality Date  . ADENOIDECTOMY    . MYRINGOTOMY WITH TUBE PLACEMENT    . TONSILLECTOMY     Social History   Socioeconomic History  . Marital status: Media planner    Spouse name: Not on file  . Number of children: 0  . Years of education: Not on file  . Highest education  level: 10th grade  Occupational History  . Not on file  Social Needs  . Financial resource strain: Somewhat hard  . Food insecurity:    Worry: Sometimes true    Inability: Sometimes true  . Transportation needs:    Medical: Yes    Non-medical: Yes  Tobacco Use  . Smoking status: Former Smoker    Packs/day: 0.50    Years: 5.00    Pack years: 2.50    Types: Cigarettes    Last attempt to quit: 04/28/2018    Years since quitting: 0.4  . Smokeless tobacco: Never Used  Substance and Sexual Activity  . Alcohol use: No  . Drug use: Yes    Types: Marijuana    Comment: last smoked this am  . Sexual activity: Yes    Birth control/protection: None  Lifestyle  . Physical activity:    Days per week: 0 days    Minutes per session: 0 min  . Stress: Not on file  Relationships  . Social connections:    Talks on phone: Not on file    Gets together: Not on file    Attends religious service: Not on file    Active member of club or organization: Not on file    Attends meetings of clubs or organizations: Not on file    Relationship status: Not on file  . Intimate partner violence:    Fear of current  or ex partner: No    Emotionally abused: No    Physically abused: No    Forced sexual activity: No  Other Topics Concern  . Not on file  Social History Narrative  . Not on file   Family History  Problem Relation Age of Onset  . Cirrhosis Mother    Current Outpatient Medications on File Prior to Visit  Medication Sig  . albuterol (PROVENTIL HFA;VENTOLIN HFA) 108 (90 Base) MCG/ACT inhaler Inhale 2 puffs into the lungs every 6 (six) hours as needed.  . busPIRone (BUSPAR) 10 MG tablet Take 20 mg by mouth 2 (two) times daily.  . cholecalciferol (VITAMIN D3) 25 MCG (1000 UT) tablet Take 1,000 Units by mouth daily.  Marland Kitchen dimenhyDRINATE (DRAMAMINE) 50 MG tablet Take 50 mg by mouth daily as needed.   No current facility-administered medications on file prior to visit.     Review of Systems    Constitutional: Negative for activity change, appetite change and fatigue.  Respiratory: Negative for cough and shortness of breath.   Cardiovascular: Negative for chest pain, palpitations and leg swelling.  Gastrointestinal: Negative for constipation, diarrhea, nausea and vomiting.  Genitourinary: Negative for dysuria, frequency and urgency.  Musculoskeletal: Negative for arthralgias and myalgias.  Skin: Negative for rash.  Neurological: Negative for dizziness and headaches.  Psychiatric/Behavioral: Negative for dysphoric mood and suicidal ideas. The patient is not nervous/anxious.    Per HPI unless specifically indicated above     Objective:    BP 125/69 (BP Location: Left Arm, Patient Position: Sitting, Cuff Size: Normal)   Pulse 85   Temp 98.5 F (36.9 C) (Oral)   Ht 5' 5.5" (1.664 m)   Wt 184 lb (83.5 kg)   BMI 30.15 kg/m   Wt Readings from Last 3 Encounters:  09/20/18 184 lb (83.5 kg)  05/05/18 175 lb (79.4 kg)  09/27/17 170 lb (77.1 kg)    Physical Exam Vitals signs reviewed.  Constitutional:      General: She is not in acute distress.    Appearance: She is well-developed. She is obese.  HENT:     Head: Normocephalic and atraumatic.  Cardiovascular:     Rate and Rhythm: Normal rate and regular rhythm.     Pulses:          Radial pulses are 2+ on the right side and 2+ on the left side.       Posterior tibial pulses are 1+ on the right side and 1+ on the left side.     Heart sounds: Normal heart sounds, S1 normal and S2 normal.  Pulmonary:     Effort: Pulmonary effort is normal. No respiratory distress.     Breath sounds: Normal breath sounds and air entry.  Abdominal:     General: Bowel sounds are normal. There is no distension.     Palpations: Abdomen is soft. There is no hepatomegaly or splenomegaly.     Tenderness: There is abdominal tenderness (mild) in the epigastric area.     Hernia: No hernia is present.  Musculoskeletal:     Right lower leg: No edema.      Left lower leg: No edema.  Skin:    General: Skin is warm and dry.     Capillary Refill: Capillary refill takes less than 2 seconds.  Neurological:     Mental Status: She is alert and oriented to person, place, and time.  Psychiatric:        Attention and Perception: Attention normal.  Mood and Affect: Mood and affect normal.        Behavior: Behavior normal. Behavior is cooperative.        Thought Content: Thought content normal.        Judgment: Judgment normal.       Assessment & Plan:   Problem List Items Addressed This Visit    None    Visit Diagnoses    Gastroesophageal reflux disease, esophagitis presence not specified    -  Primary   Relevant Medications   dimenhyDRINATE (DRAMAMINE) 50 MG tablet   omeprazole (PRILOSEC) 20 MG capsule   Other Relevant Orders   CBC with Differential/Platelet   COMPLETE METABOLIC PANEL WITH GFR   Anxiety       Relevant Medications   hydrOXYzine (ATARAX/VISTARIL) 10 MG tablet   Other Relevant Orders   TSH   Nausea       Relevant Orders   CBC with Differential/Platelet   COMPLETE METABOLIC PANEL WITH GFR   TSH   Diarrhea, unspecified type       Relevant Orders   CBC with Differential/Platelet   COMPLETE METABOLIC PANEL WITH GFR   TSH   Encounter to establish care          #Previous PCP was at many years ago.  Records will not be requested.  Past medical, family, and surgical history reviewed w/ pt.  # Anxiety Uncontrolled. Continue management at [facility]. Patient continues having somatic symptoms of anxiety as likely cause of GI upset and nausea. - START hydroxyzine 10-20 mg tid prn anxiety/panic.   - Further discuss with [facility] for future assistance with anxiety/panic.  # GERD Mildly improved with H2blocker daily and resolved sore throat.  Patient may have silent reflux and may benefit further from PPI.  - START omeprazole 20 mg once daily - May add IBGard for abdominal pain possibly related to  IBS if no improvement with GERD treatment. - For diarrhea, may take Imodium AD OTC prn 3 times per week. - Follow-up 4 weeks.   Meds ordered this encounter  Medications  . omeprazole (PRILOSEC) 20 MG capsule    Sig: Take 1 capsule (20 mg total) by mouth daily.    Dispense:  90 capsule    Refill:  1    Order Specific Question:   Supervising Provider    Answer:   Smitty Cords [[id number]]  . hydrOXYzine (ATARAX/VISTARIL) 10 MG tablet    Sig: Take 1-2 tablets (10-20 mg total) by mouth 3 (three) times daily as needed for anxiety.    Dispense:  60 tablet    Refill:  1    Order Specific Question:   Supervising Provider    Answer:   Smitty Cords [[id number]]     Follow up plan: Return in about 4 weeks (around 10/18/2018) for GI upset and breast exam.  [name], DNP, AGPCNP-BC Adult Gerontology Primary Care Nurse Practitioner [facility]  [facility] 09/20/2018, 3:51 PM

## 2018-09-21 ENCOUNTER — Encounter: Payer: Self-pay | Admitting: Nurse Practitioner

## 2018-09-27 ENCOUNTER — Encounter: Payer: Self-pay | Admitting: Nurse Practitioner

## 2018-10-21 ENCOUNTER — Ambulatory Visit: Payer: Self-pay | Admitting: Nurse Practitioner

## 2018-10-25 ENCOUNTER — Other Ambulatory Visit: Payer: Self-pay | Admitting: Nurse Practitioner

## 2018-10-25 DIAGNOSIS — F419 Anxiety disorder, unspecified: Secondary | ICD-10-CM

## 2018-11-11 ENCOUNTER — Ambulatory Visit: Payer: Self-pay | Admitting: Nurse Practitioner

## 2018-11-12 ENCOUNTER — Encounter: Payer: Self-pay | Admitting: Family Medicine

## 2018-11-12 ENCOUNTER — Ambulatory Visit (INDEPENDENT_AMBULATORY_CARE_PROVIDER_SITE_OTHER): Payer: Self-pay | Admitting: Family Medicine

## 2018-11-12 ENCOUNTER — Other Ambulatory Visit: Payer: Self-pay

## 2018-11-12 DIAGNOSIS — F331 Major depressive disorder, recurrent, moderate: Secondary | ICD-10-CM | POA: Insufficient documentation

## 2018-11-12 DIAGNOSIS — F411 Generalized anxiety disorder: Secondary | ICD-10-CM | POA: Insufficient documentation

## 2018-11-12 MED ORDER — BUPROPION HCL ER (XL) 150 MG PO TB24: 150.0000 mg | ORAL_TABLET | Freq: Every day | ORAL | 2 refills | Status: DC

## 2018-11-12 NOTE — Patient Instructions (Addendum)
Stop Hydroxyzine if not effective  Continue Buspar  START Wellbutrin (Buproprion) 150mg  once daily (24 hr pill) - new rx - may take few weeks to take full effect.  Discuss with Lauren at next visit if you have persistent GI symptoms - we can try an older mood medicine - TCA category - such as Desipramine, Nortriptyline, Amitriptyline these medicines can help with both and may be a good fit for you.  Also please keep up with your [facility] for further advice and let them know that we started the Wellbutrin.  Please schedule a Follow-up Appointment to: Return in about 3 months (around 02/11/2019) for Mood/Anxiety med adjust, with PCP.  If you have any other questions or concerns, please feel free to call the office or send a message through MyChart. You may also schedule an earlier appointment if necessary.  Additionally, you may be receiving a survey about your experience at our office within a few days to 1 week by e-mail or mail. We value your feedback.  Saralyn Pilar, DO [facility], [place]

## 2018-11-12 NOTE — Progress Notes (Signed)
Virtual Visit via Telephone The purpose of this virtual visit is to provide medical care while limiting exposure to the novel coronavirus (COVID19) for both patient and office staff.  Consent was obtained for phone visit:  Yes.   Answered questions that patient had about telehealth interaction:  Yes.   I discussed the limitations, risks, security and privacy concerns of performing an evaluation and management service by telephone. I also discussed with the patient that there may be a patient responsible charge related to this service. The patient expressed understanding and agreed to proceed.  Patient Location: Home Provider Location: [facility] (Office)  PCP is Wilhelmina McardleLauren Kennedy, AGPCNP-BC - I am currently covering during her maternity leave.  ---------------------------------------------------------------------- Chief Complaint  Patient presents with  . Anxiety    SOB related to anxiety, Pt states her anxiety been at a all time high. Shes currently taking the hydroxyzine TID daily.    S: Reviewed CMA documentation. I have called patient and gathered additional HPI as follows:  She was planning for physical and labs but due to coronavirus pandemic this was delayed.  Follow-up Generalized Anixety Disorder / Major Depression recurrent moderate  - Last visit with PCP Lauren 09/2018, had prior history since 2015 of anxiety and related GI functional issues with some dyspepsia and nausea and upset stomach, treated with hydroxyzine at that time for anxiety, see prior notes for background information. - Interval update with limited results on Hydroxyzine despite taking regularly, still having anxiety and mood symptoms - Today patient reports concerns with mood anxiety, and panic attacks at times, request to consider other medication now - Previous medications included Paxil, Paroxetine, Prozac, Celexa, Lamotrigine - Family history of Mother with anxiety and mental health issue See  PHQ GAD score for ROS  Denies any high risk travel to areas of current concern for COVID19. Denies any known or suspected exposure to person with or possibly with COVID19.  Denies any fevers, chills, sweats, body ache, cough, sinus pain or pressure, headache, abdominal pain, diarrhea  Past Medical History:  Diagnosis Date  . Anxiety   . Depression    Social History   Tobacco Use  . Smoking status: Former Smoker    Packs/day: 0.50    Years: 5.00    Pack years: 2.50    Types: Cigarettes    Last attempt to quit: 04/28/2018    Years since quitting: 0.5  . Smokeless tobacco: Never Used  Substance Use Topics  . Alcohol use: No  . Drug use: Yes    Types: Marijuana    Comment: last smoked this am    Current Outpatient Medications:  .  albuterol (PROVENTIL HFA;VENTOLIN HFA) 108 (90 Base) MCG/ACT inhaler, Inhale 2 puffs into the lungs every 6 (six) hours as needed., Disp: 1 Inhaler, Rfl: 0 .  busPIRone (BUSPAR) 10 MG tablet, Take 20 mg by mouth 2 (two) times daily., Disp: , Rfl:  .  cholecalciferol (VITAMIN D3) 25 MCG (1000 UT) tablet, Take 1,000 Units by mouth daily., Disp: , Rfl:  .  dimenhyDRINATE (DRAMAMINE) 50 MG tablet, Take 50 mg by mouth daily as needed., Disp: , Rfl:  .  hydrOXYzine (ATARAX/VISTARIL) 10 MG tablet, TAKE 1 TO 2 TABLETS BY MOUTH THREE TIMES DAILY AS NEEDED FOR ANXIETY, Disp: 180 tablet, Rfl: 0 .  omeprazole (PRILOSEC) 20 MG capsule, Take 1 capsule (20 mg total) by mouth daily., Disp: 90 capsule, Rfl: 1 .  buPROPion (WELLBUTRIN XL) 150 MG 24 hr tablet, Take 1 tablet (  150 mg total) by mouth daily., Disp: 30 tablet, Rfl: 2  Depression screen [facility] 2/9 11/12/2018 09/20/2018  Decreased Interest 1 3  Down, Depressed, Hopeless 2 3  PHQ - 2 Score 3 6  Altered sleeping 3 2  Tired, decreased energy 3 3  Change in appetite 1 3  Feeling bad or failure about yourself  3 2  Trouble concentrating 0 1  Moving slowly or fidgety/restless 2 1  Suicidal thoughts 0 0  PHQ-9 Score  15 18  Difficult doing work/chores Somewhat difficult Very difficult    GAD 7 : Generalized Anxiety Score 11/12/2018  Nervous, Anxious, on Edge 3  Control/stop worrying 3  Worry too much - different things 2  Trouble relaxing 2  Restless 2  Easily annoyed or irritable 1  Afraid - awful might happen 2  Total GAD 7 Score 15  Anxiety Difficulty Very difficult    -------------------------------------------------------------------------- O: No physical exam performed due to remote telephone encounter.  Lab results reviewed.  No results found for this or any previous visit (from the past 2160 hour(s)).  -------------------------------------------------------------------------- A&P:  Problem List Items Addressed This Visit    GAD (generalized anxiety disorder) - Primary   Relevant Medications   buPROPion (WELLBUTRIN XL) 150 MG 24 hr tablet   Major depressive disorder, recurrent, moderate (HCC)   Relevant Medications   buPROPion (WELLBUTRIN XL) 150 MG 24 hr tablet     Clinically with mixed mood disorder major depression recurrent now moderate active and GAD symptoms combined, comorbid mental health conditions - and family history of mental health conditions currently flared up more now. - Multiple prior med failures including SSRI SNRI Paxil, Paroxetine, Prozac, Celexa, Lamotrigine  Plan - Discussed med options available - considered a few options - Start buproprion XL 150mg  daily, consider dose adjust in future - she understands this is likely only for her depression, but believes that may help her anxiety as well, avoid dosing late to limit insomnia risk - Also offered but deferred now - TCA option - for both depression and functional GI symptoms as indicated by off-label use and often used by GI, low dose such as desipramine nightly or amitriptyline nightly, would reconsider this in future, cannot use in conjunction w/ SSRI SNRI - Considered Lexapro - but decided against bc failed  multiple SSRI including celexa Follow-up with PCP in 3 months sooner if need  Meds ordered this encounter  Medications  . buPROPion (WELLBUTRIN XL) 150 MG 24 hr tablet    Sig: Take 1 tablet (150 mg total) by mouth daily.    Dispense:  30 tablet    Refill:  2    Follow-up: - Return in 3 months for Mood / Anxiety med adjust with PCP upon return  Patient verbalizes understanding with the above medical recommendations including the limitation of remote medical advice.  Specific follow-up and call-back criteria were given for patient to follow-up or seek medical care more urgently if needed.  - Time spent in direct consultation with patient on phone: 14 minutes  Saralyn Pilar, DO [facility] 11/12/2018, 2:01 PM

## 2018-11-13 ENCOUNTER — Encounter: Payer: Self-pay | Admitting: Family Medicine

## 2018-12-01 ENCOUNTER — Other Ambulatory Visit: Payer: Self-pay | Admitting: Nurse Practitioner

## 2018-12-01 DIAGNOSIS — F419 Anxiety disorder, unspecified: Secondary | ICD-10-CM

## 2018-12-02 NOTE — Telephone Encounter (Signed)
Saw patient on 4/24 - her meds were changed, she was asked to STOP hydroxyzine if no longer effective.  Can you check with patient - on this refill request? Is she still using it and is it helpful? Or is this an automatic request that we can decline.  Saralyn Pilar, DO [facility] 12/02/2018, 1:00 PM

## 2018-12-03 NOTE — Telephone Encounter (Signed)
The pt is still taking it, but she only takes it PRN which is usually only once most days, but admits sometimes twice a day.

## 2018-12-03 NOTE — Telephone Encounter (Signed)
Attempted to contact the pt, no answer, LMOm to return my call.

## 2018-12-06 ENCOUNTER — Other Ambulatory Visit: Payer: Self-pay | Admitting: Nurse Practitioner

## 2018-12-06 DIAGNOSIS — F419 Anxiety disorder, unspecified: Secondary | ICD-10-CM

## 2018-12-06 NOTE — Telephone Encounter (Signed)
I have refilled Hydroxyzine today.  Saralyn Pilar, DO [facility] 12/06/2018, 12:56 PM

## 2018-12-16 DIAGNOSIS — J9801 Acute bronchospasm: Secondary | ICD-10-CM

## 2018-12-17 MED ORDER — ALBUTEROL SULFATE HFA 108 (90 BASE) MCG/ACT IN AERS: 2.0000 | INHALATION_SPRAY | Freq: Four times a day (QID) | RESPIRATORY_TRACT | 1 refills | Status: DC | PRN

## 2019-01-22 ENCOUNTER — Other Ambulatory Visit: Payer: Self-pay | Admitting: Nurse Practitioner

## 2019-01-22 DIAGNOSIS — F419 Anxiety disorder, unspecified: Secondary | ICD-10-CM

## 2019-01-24 IMAGING — CR DG HAND COMPLETE 3+V*R*
1 series · 3 of 3 positions shown · non-contrast
Comparison: None.

CLINICAL DATA: Pt states she punched a wall 2 weeks ago pain and
bruising 5th metacarpal. shielded

EXAM:
RIGHT HAND - COMPLETE 3+ VIEW

[Series 1: x hand pa right · 0.14mm/px · 3 of 3 slices shown]
[im 1/3]
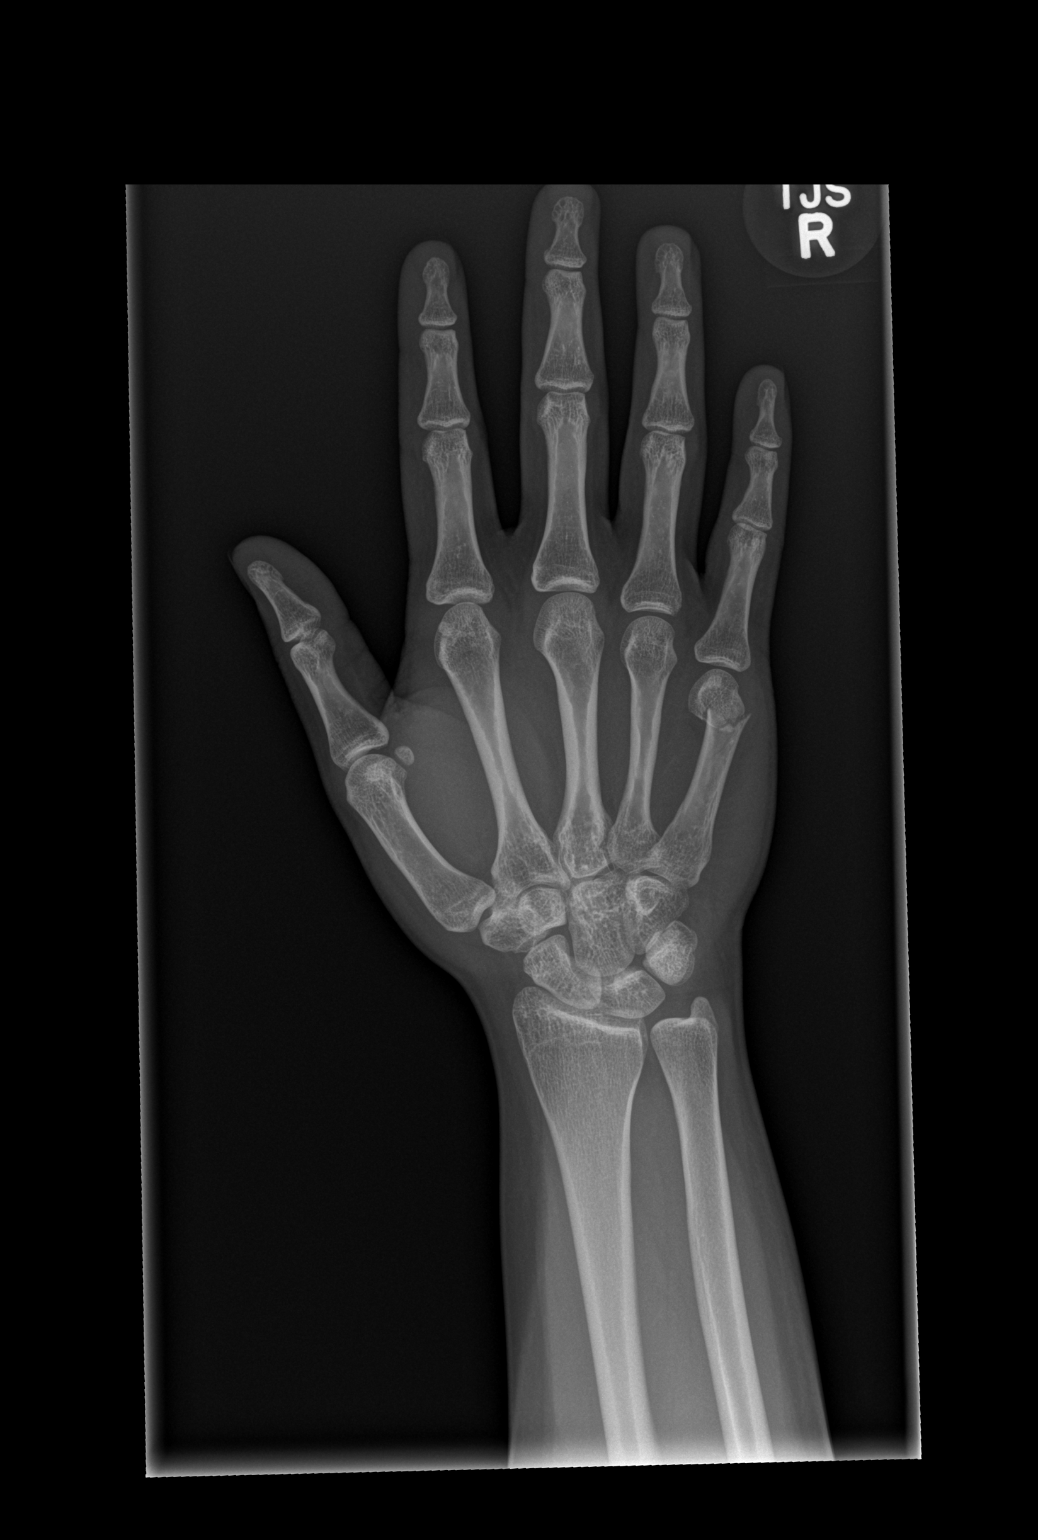
[im 2/3]
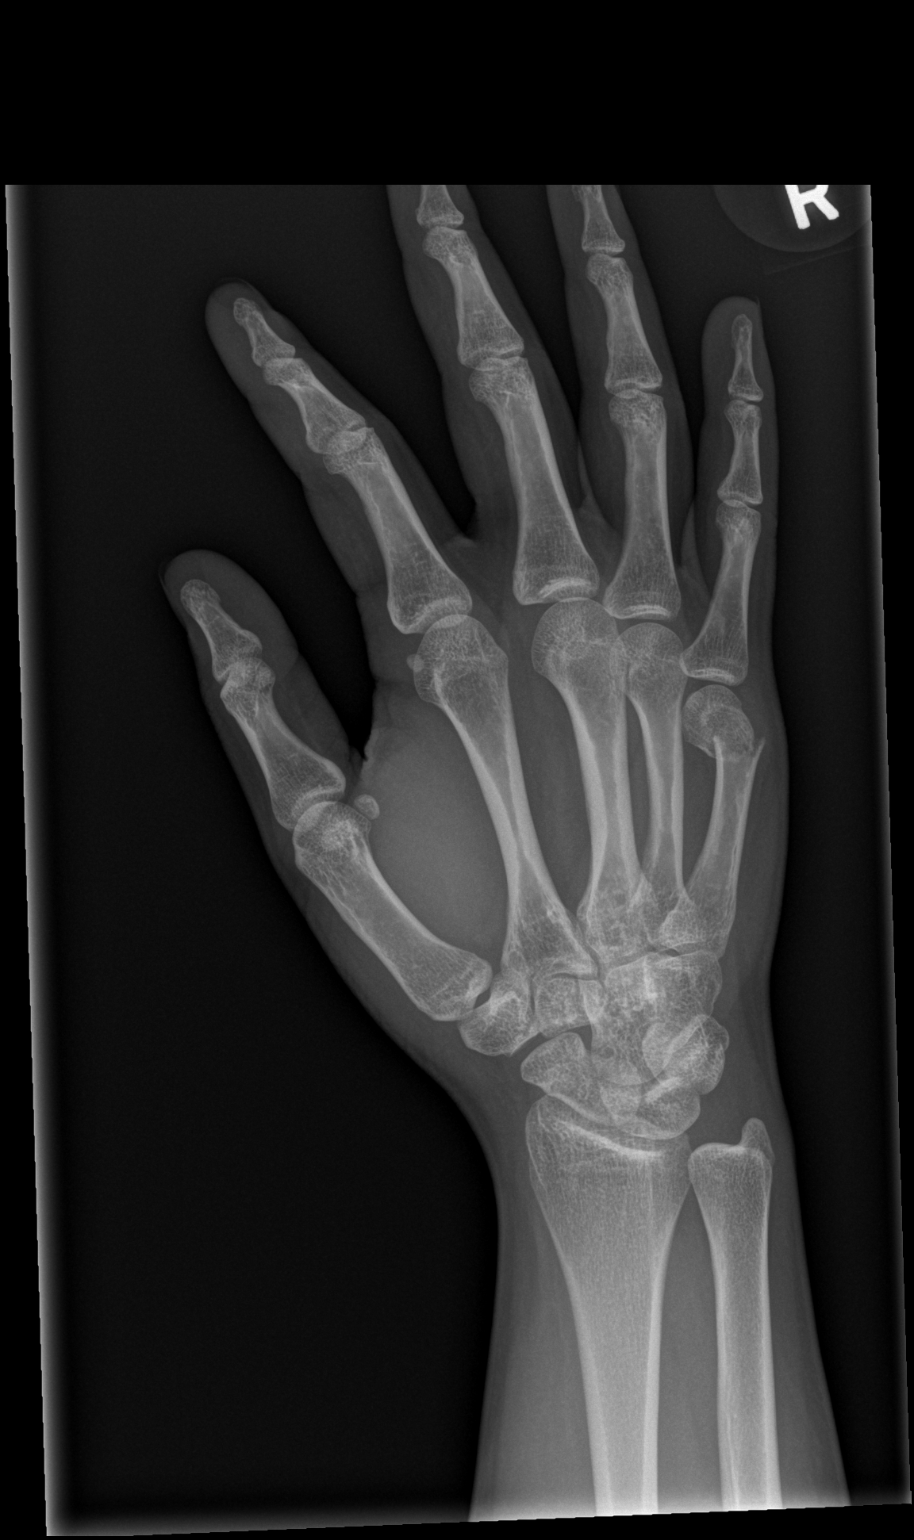
[im 3/3]
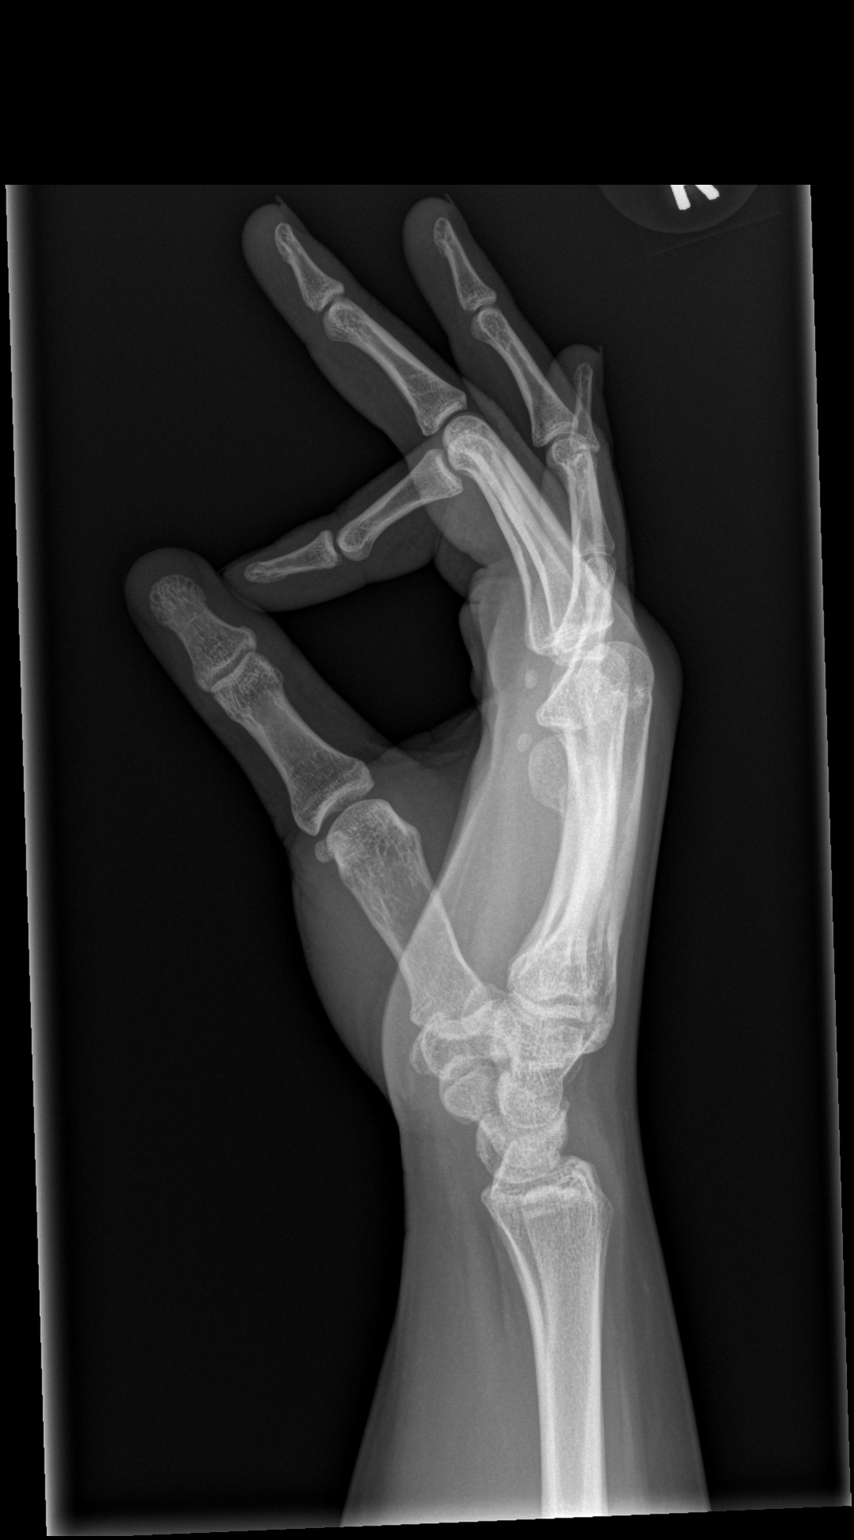

[3 of 3 positions shown; findings below may reference images not displayed]

FINDINGS: Displaced fracture within the distal fifth metacarpal bone, with
associated angulation deformity. No other osseous fracture or
dislocation. Soft tissues about the right hand are unremarkable.
IMPRESSION: Displaced fracture within the distal portion of the fifth metacarpal
bone, centered at the base of the metacarpal head, with associated
angulation deformity approaching 60 degrees. Alignment at the
overlying fifth MCP joint remains normal.

## 2019-02-08 ENCOUNTER — Other Ambulatory Visit: Payer: Self-pay | Admitting: Family Medicine

## 2019-02-08 DIAGNOSIS — F331 Major depressive disorder, recurrent, moderate: Secondary | ICD-10-CM

## 2019-02-17 DIAGNOSIS — F411 Generalized anxiety disorder: Secondary | ICD-10-CM

## 2019-02-17 DIAGNOSIS — F331 Major depressive disorder, recurrent, moderate: Secondary | ICD-10-CM

## 2019-02-18 MED ORDER — BUSPIRONE HCL 10 MG PO TABS: 20.0000 mg | ORAL_TABLET | Freq: Two times a day (BID) | ORAL | 0 refills | Status: DC

## 2019-02-21 ENCOUNTER — Ambulatory Visit (INDEPENDENT_AMBULATORY_CARE_PROVIDER_SITE_OTHER): Payer: Self-pay | Admitting: Nurse Practitioner

## 2019-02-21 ENCOUNTER — Other Ambulatory Visit: Payer: Self-pay

## 2019-02-21 ENCOUNTER — Encounter: Payer: Self-pay | Admitting: Nurse Practitioner

## 2019-02-21 VITALS — BP 109/74 | HR 93 | Ht 65.5 in | Wt 200.8 lb

## 2019-02-21 DIAGNOSIS — L918 Other hypertrophic disorders of the skin: Secondary | ICD-10-CM

## 2019-02-21 DIAGNOSIS — F331 Major depressive disorder, recurrent, moderate: Secondary | ICD-10-CM

## 2019-02-21 DIAGNOSIS — F411 Generalized anxiety disorder: Secondary | ICD-10-CM

## 2019-02-21 MED ORDER — BUPROPION HCL ER (XL) 300 MG PO TB24: 300.0000 mg | ORAL_TABLET | Freq: Every day | ORAL | 2 refills | Status: DC

## 2019-02-21 NOTE — Progress Notes (Signed)
Subjective:    Patient ID: Sydney Clark, female    DOB: [birth year], 23 y.o.   MRN: [id number]  Sydney Clark is a 23 y.o. female presenting on 02/21/2019 for Anxiety   HPI Anxiety Patient had addition of hydroxyzine at last visit and started Wellbutrin after last visit.  She had previously been on Buspar.  She is now taking buspirone 20 mg bid, hydroxyzine 20 mg bid, Wellbutrin 150 mg daily.  Patient was established at [facility]. Is continuing to call to try to schedule, but has not been successful with getting them to answer the phone. - Hydroxyzine is helping some, so she is taking twice daily now.  She stopped taking for a few days and noticed more anxiety. - Patient has a light box.  Didn't use consistently last winter, but desires to start using it regularly in fall this year.  She was using in am when she woke up, but she usually wakes up after the sun rises.  Doesn't go to bed until 11 pm - 12 am.    Skin tag on back, face Skin tag on face is growing slightly larger.  Also notes one on her back under bra line.  Neither are being disrupted traumatically.  Face appearance is patient's main driving factor for wanting removal.  GAD 7 : Generalized Anxiety Score 02/21/2019 11/12/2018  Nervous, Anxious, on Edge 2 3  Control/stop worrying 2 3  Worry too much - different things 1 2  Trouble relaxing 1 2  Restless 0 2  Easily annoyed or irritable 1 1  Afraid - awful might happen 0 2  Total GAD 7 Score 7 15  Anxiety Difficulty Somewhat difficult Very difficult   Depression screen [facility] 2/9 02/21/2019 11/12/2018 09/20/2018  Decreased Interest 0 1 3  Down, Depressed, Hopeless 1 2 3   PHQ - 2 Score 1 3 6   Altered sleeping 2 3 2   Tired, decreased energy 2 3 3   Change in appetite 3 1 3   Feeling bad or failure about yourself  2 3 2   Trouble concentrating 2 0 1  Moving slowly or fidgety/restless 0 2 1  Suicidal thoughts 0 0 0  PHQ-9 Score 12 15 18   Difficult doing work/chores  Somewhat difficult Somewhat difficult Very difficult   Social History   Tobacco Use  . Smoking status: Former Smoker    Packs/day: 0.50    Years: 5.00    Pack years: 2.50    Types: Cigarettes    Quit date: 04/28/2018    Years since quitting: 0.8  . Smokeless tobacco: Never Used  Substance Use Topics  . Alcohol use: No  . Drug use: Yes    Types: Marijuana    Comment: last smoked this am    Review of Systems Per HPI unless specifically indicated above     Objective:    BP 109/74 (BP Location: Right Arm, Patient Position: Sitting, Cuff Size: Normal)   Pulse 93   Ht 5' 5.5" (1.664 m)   Wt 200 lb 12.8 oz (91.1 kg)   BMI 32.91 kg/m   Wt Readings from Last 3 Encounters:  02/21/19 200 lb 12.8 oz (91.1 kg)  09/20/18 184 lb (83.5 kg)  05/05/18 175 lb (79.4 kg)    Physical Exam Vitals signs reviewed.  Constitutional:      General: She is not in acute distress.    Appearance: She is well-developed.  HENT:     Head: Normocephalic and atraumatic.  Cardiovascular:  Rate and Rhythm: Normal rate and regular rhythm.     Pulses:          Radial pulses are 2+ on the right side and 2+ on the left side.       Posterior tibial pulses are 1+ on the right side and 1+ on the left side.     Heart sounds: Normal heart sounds, S1 normal and S2 normal.  Pulmonary:     Effort: Pulmonary effort is normal. No respiratory distress.     Breath sounds: Normal breath sounds and air entry.  Musculoskeletal:     Right lower leg: No edema.     Left lower leg: No edema.  Skin:    General: Skin is warm and dry.     Capillary Refill: Capillary refill takes less than 2 seconds.       Neurological:     Mental Status: She is alert and oriented to person, place, and time.  Psychiatric:        Attention and Perception: Attention normal.        Mood and Affect: Mood and affect normal.        Behavior: Behavior normal. Behavior is cooperative.    Results for orders placed or performed during the  hospital encounter of 08/16/16  Lipase, blood  Result Value Ref Range   Lipase 21 11 - 51 U/L  Comprehensive metabolic panel  Result Value Ref Range   Sodium 137 135 - 145 mmol/L   Potassium 3.7 3.5 - 5.1 mmol/L   Chloride 106 101 - 111 mmol/L   CO2 22 22 - 32 mmol/L   Glucose, Bld 92 65 - 99 mg/dL   BUN 13 6 - 20 mg/dL   Creatinine, Ser 4.090.77 0.44 - 1.00 mg/dL   Calcium 9.4 8.9 - 81.110.3 mg/dL   Total Protein 8.3 (H) 6.5 - 8.1 g/dL   Albumin 5.1 (H) 3.5 - 5.0 g/dL   AST 24 15 - 41 U/L   ALT 23 14 - 54 U/L   Alkaline Phosphatase 52 38 - 126 U/L   Total Bilirubin 0.6 0.3 - 1.2 mg/dL   GFR calc non Af Amer >60 >60 mL/min   GFR calc Af Amer >60 >60 mL/min   Anion gap 9 5 - 15  CBC  Result Value Ref Range   WBC 7.3 3.6 - 11.0 K/uL   RBC 4.39 3.80 - 5.20 MIL/uL   Hemoglobin 13.9 12.0 - 16.0 g/dL   HCT 91.440.1 78.235.0 - 95.647.0 %   MCV 91.2 80.0 - 100.0 fL   MCH 31.6 26.0 - 34.0 pg   MCHC 34.6 32.0 - 36.0 g/dL   RDW 21.313.5 08.611.5 - 57.814.5 %   Platelets 241 150 - 440 K/uL  Urinalysis, Complete w Microscopic  Result Value Ref Range   Color, Urine YELLOW (A) YELLOW   APPearance CLEAR (A) CLEAR   Specific Gravity, Urine 1.027 1.005 - 1.030   pH 6.0 5.0 - 8.0   Glucose, UA NEGATIVE NEGATIVE mg/dL   Hgb urine dipstick NEGATIVE NEGATIVE   Bilirubin Urine NEGATIVE NEGATIVE   Ketones, ur 20 (A) NEGATIVE mg/dL   Protein, ur 30 (A) NEGATIVE mg/dL   Nitrite NEGATIVE NEGATIVE   Leukocytes, UA NEGATIVE NEGATIVE   RBC / HPF 0-5 0 - 5 RBC/hpf   WBC, UA 0-5 0 - 5 WBC/hpf   Bacteria, UA RARE (A) NONE SEEN   Squamous Epithelial / LPF 0-5 (A) NONE SEEN   Mucus PRESENT  Pregnancy, urine POC  Result Value Ref Range   Preg Test, Ur NEGATIVE NEGATIVE      Assessment & Plan:   Problem List Items Addressed This Visit      Other   GAD (generalized anxiety disorder) - Primary   Relevant Medications   buPROPion (WELLBUTRIN XL) 300 MG 24 hr tablet   Major depressive disorder, recurrent, moderate  (HCC) GAD and MDD improving on new Wellbutrin.  Continues to need psychiatry but has difficulty getting an appointment.  Plan: 1. Continue buspirone without changes 2. Increase Wellbutrin to 300 mg daily since tolerating well and effective. 3. Continue hydroxyzine 25 mg bid and may take up to tid prn. 4. Continue light box therapy in winter.  Shift use to evening since she has later bed time. 5. Follow-up 3 months and sooner prn.   Relevant Medications   buPROPion (WELLBUTRIN XL) 300 MG 24 hr tablet    Other Visit Diagnoses    Skin tag     Stable without signs and symptoms of malignancy.  Cosmetic removal desired.  - Referral placed to dermatology. - Follow-up prn.  Can remove tag on patient's back here if needed.   Relevant Orders   Ambulatory referral to Dermatology      Meds ordered this encounter  Medications  . buPROPion (WELLBUTRIN XL) 300 MG 24 hr tablet    Sig: Take 1 tablet (300 mg total) by mouth daily.    Dispense:  30 tablet    Refill:  2    Order Specific Question:   Supervising Provider    Answer:   Smitty CordsKARAMALEGOS, ALEXANDER J [[id number]]    Follow up plan: Return in about 3 months (around 05/24/2019) for depression, anxiety.   A total of 25 minutes was spent face-to-face with this patient. Greater than 50% of this time was spent in counseling and coordination of care with the patient regarding anxiety, depression, med changes, light box therapy.    Wilhelmina McardleLauren Eliseo Withers, DNP, AGPCNP-BC Adult Gerontology Primary Care Nurse Practitioner [facility] 02/21/2019, 1:24 PM

## 2019-02-21 NOTE — Patient Instructions (Addendum)
Sydney Clark,   Thank you for coming in to clinic today.  1. Increase Wellbutrin to 300 mg once daily  2. Use your light box during awake daylight hours (6am-7/8pm)  Please schedule a follow-up appointment with Cassell Smiles, AGNP. Return in about 3 months (around 05/24/2019) for depression, anxiety.  If you have any other questions or concerns, please feel free to call the clinic or send a message through [place]. You may also schedule an earlier appointment if necessary.  You will receive a survey after today's visit either digitally by e-mail or paper by [employer]. Your experiences and feedback matter to [place].  Please respond so we know how we are doing as we provide care for you.   Cassell Smiles, DNP, AGNP-BC Adult Gerontology Nurse Practitioner [place]

## 2019-03-26 ENCOUNTER — Other Ambulatory Visit: Payer: Self-pay | Admitting: Family Medicine

## 2019-03-26 DIAGNOSIS — F419 Anxiety disorder, unspecified: Secondary | ICD-10-CM

## 2019-03-31 ENCOUNTER — Other Ambulatory Visit: Payer: Self-pay | Admitting: Nurse Practitioner

## 2019-03-31 DIAGNOSIS — F411 Generalized anxiety disorder: Secondary | ICD-10-CM

## 2019-03-31 DIAGNOSIS — F331 Major depressive disorder, recurrent, moderate: Secondary | ICD-10-CM

## 2019-04-01 ENCOUNTER — Encounter: Payer: Self-pay | Admitting: Nurse Practitioner

## 2019-04-01 DIAGNOSIS — F411 Generalized anxiety disorder: Secondary | ICD-10-CM

## 2019-04-01 DIAGNOSIS — F331 Major depressive disorder, recurrent, moderate: Secondary | ICD-10-CM

## 2019-04-01 MED ORDER — BUSPIRONE HCL 10 MG PO TABS: 20.0000 mg | ORAL_TABLET | Freq: Two times a day (BID) | ORAL | 0 refills | Status: DC

## 2019-04-01 NOTE — Telephone Encounter (Signed)
I would recommend patient considering re-establishing with therapist at [facility] or new therapist at [facility].  Needs office visit with me for medication review at minimum.  Will provide 30 day supply prior to another appointment with me.

## 2019-04-14 ENCOUNTER — Other Ambulatory Visit: Payer: Self-pay | Admitting: Nurse Practitioner

## 2019-04-14 DIAGNOSIS — F419 Anxiety disorder, unspecified: Secondary | ICD-10-CM

## 2019-04-15 ENCOUNTER — Encounter: Payer: Self-pay | Admitting: Nurse Practitioner

## 2019-04-15 ENCOUNTER — Other Ambulatory Visit: Payer: Self-pay | Admitting: Nurse Practitioner

## 2019-04-15 DIAGNOSIS — F419 Anxiety disorder, unspecified: Secondary | ICD-10-CM

## 2019-05-08 ENCOUNTER — Other Ambulatory Visit: Payer: Self-pay | Admitting: Nurse Practitioner

## 2019-05-08 DIAGNOSIS — F411 Generalized anxiety disorder: Secondary | ICD-10-CM

## 2019-05-08 DIAGNOSIS — F331 Major depressive disorder, recurrent, moderate: Secondary | ICD-10-CM

## 2019-05-18 MED ORDER — HYDROXYZINE HCL 25 MG PO TABS: 25.0000 mg | ORAL_TABLET | Freq: Three times a day (TID) | ORAL | 1 refills | Status: DC | PRN

## 2019-05-24 ENCOUNTER — Ambulatory Visit (INDEPENDENT_AMBULATORY_CARE_PROVIDER_SITE_OTHER): Payer: Self-pay | Admitting: Family Medicine

## 2019-05-24 ENCOUNTER — Other Ambulatory Visit: Payer: Self-pay

## 2019-05-24 ENCOUNTER — Encounter: Payer: Self-pay | Admitting: Family Medicine

## 2019-05-24 ENCOUNTER — Ambulatory Visit: Payer: Self-pay | Admitting: Nurse Practitioner

## 2019-05-24 VITALS — BP 123/56 | HR 76 | Temp 98.9°F | Resp 16 | Ht 65.5 in | Wt 196.0 lb

## 2019-05-24 DIAGNOSIS — F331 Major depressive disorder, recurrent, moderate: Secondary | ICD-10-CM

## 2019-05-24 DIAGNOSIS — F411 Generalized anxiety disorder: Secondary | ICD-10-CM

## 2019-05-24 MED ORDER — BUPROPION HCL ER (XL) 300 MG PO TB24: 300.0000 mg | ORAL_TABLET | Freq: Every day | ORAL | 1 refills | Status: DC

## 2019-05-24 NOTE — Patient Instructions (Addendum)
Thank you for coming to the office today.  Refilled Wellbutrin XL 300mg  daily for 90 days with 1 refill  You should have refills on the others, let us know if needed.  Lauren's replacement, Elmyra Ricks will be here in February, can discuss wellness / physical etc at that time.  Please schedule a Follow-up Appointment to: Return in about 4 months (around 09/21/2019) for 4-6 months with new PCP, follow-up Mood/Anxiety med refills.  If you have any other questions or concerns, please feel free to call the office or send a message through [place]. You may also schedule an earlier appointment if necessary.  Additionally, you may be receiving a survey about your experience at our office within a few days to 1 week by e-mail or mail. We value your feedback.  Nobie Putnam, DO [place]

## 2019-05-24 NOTE — Progress Notes (Signed)
Subjective:    Patient ID: Sydney Clark, female    DOB: 09/20/1995, 23 y.o.   MRN: [id number]  Sydney Clark is a 23 y.o. female presenting on 05/24/2019 for Anxiety   HPI   Follow-up Generalized Anixety Disorder / Major Depression recurrent moderate  10/2018 with me virtual, started Wellbutrin XL 150mg  daily 02/2019 - PCP Wilhelmina McardleLauren Kennedy, AGPCNP-BC increased dose up to 300mg  daily She had established with [facility], has not returned Also taking Buspar 20mg  BID and Hydroxyzine 25mg  BID PRN anxiety and occasionally needs third dose in a day. Seasonal worse, winter is worse. She has not had a significant episode of depression flare at this time. She is doing good lately. She prefers to keep current doses. Needs refill wellbutrin. Has refill buspar / hydroxyzine currently. - Previous medications included Paxil, Paroxetine, Prozac, Celexa, Lamotrigine - Family history of Mother with anxiety and mental health issue See PHQ GAD score for ROS  Health Maintenance: Due for Flu Shot, declines today despite counseling on benefits   [facility]/24/2019 02/21/2019 11/12/2018  Decreased Interest 1 0 1  Down, Depressed, Hopeless 1 1 2   PHQ - 2 Score 2 1 3   Altered sleeping 1 2 3   Tired, decreased energy 3 2 3   Change in appetite 2 3 1   Feeling bad or failure about yourself  3 2 3   Trouble concentrating 2 2 0  Moving slowly or fidgety/restless 0 0 2  Suicidal thoughts 0 0 0  PHQ-9 Score 13 12 15   Difficult doing work/chores Somewhat difficult Somewhat difficult Somewhat difficult   GAD 7 : Generalized Anxiety Score 05/24/2019 02/21/2019 11/12/2018  Nervous, Anxious, on Edge 3 2 3   Control/stop worrying 2 2 3   Worry too much - different things 2 1 2   Trouble relaxing 1 1 2   Restless 0 0 2  Easily annoyed or irritable 3 1 1   Afraid - awful might happen 0 0 2  Total GAD 7 Score 11 7 15   Anxiety Difficulty Somewhat difficult Somewhat difficult Very difficult     Social History   Tobacco Use  . Smoking status: Former Smoker    Packs/day: 0.50    Years: 5.00    Pack years: 2.50    Types: Cigarettes    Quit date: 04/28/2018    Years since quitting: 1.0  . Smokeless tobacco: Former Engineer, waterUser  Substance Use Topics  . Alcohol use: No  . Drug use: Yes    Types: Marijuana    Comment: last smoked this am    Review of Systems Per HPI unless specifically indicated above     Objective:    BP (!) 123/56   Pulse 76   Temp 98.9 F (37.2 C) (Oral)   Resp 16   Ht 5' 5.5" (1.664 m)   Wt 196 lb (88.9 kg)   BMI 32.12 kg/m   Wt Readings from Last 3 Encounters:  05/24/19 196 lb (88.9 kg)  02/21/19 200 lb 12.8 oz (91.1 kg)  09/20/18 184 lb (83.5 kg)    Physical Exam Vitals signs and nursing note reviewed.  Constitutional:      General: She is not in acute distress.    Appearance: She is well-developed. She is not diaphoretic.     Comments: Well-appearing, comfortable, cooperative  HENT:     Head: Normocephalic and atraumatic.  Eyes:     General:        Right eye: No discharge.  Left eye: No discharge.     Conjunctiva/sclera: Conjunctivae normal.  Cardiovascular:     Rate and Rhythm: Normal rate.  Pulmonary:     Effort: Pulmonary effort is normal.  Skin:    General: Skin is warm and dry.     Findings: No erythema or rash.  Neurological:     Mental Status: She is alert and oriented to person, place, and time.  Psychiatric:        Behavior: Behavior normal.     Comments: Well groomed, good eye contact, normal speech and thoughts. Not appearing anxious. Good mood today.    Results for orders placed or performed during the hospital encounter of 08/16/16  Lipase, blood  Result Value Ref Range   Lipase 21 11 - 51 U/L  Comprehensive metabolic panel  Result Value Ref Range   Sodium 137 135 - 145 mmol/L   Potassium 3.7 3.5 - 5.1 mmol/L   Chloride 106 101 - 111 mmol/L   CO2 22 22 - 32 mmol/L   Glucose, Bld 92 65 - 99 mg/dL    BUN 13 6 - 20 mg/dL   Creatinine, Ser 0.77 0.44 - 1.00 mg/dL   Calcium 9.4 8.9 - 10.3 mg/dL   Total Protein 8.3 (H) 6.5 - 8.1 g/dL   Albumin 5.1 (H) 3.5 - 5.0 g/dL   AST 24 15 - 41 U/L   ALT 23 14 - 54 U/L   Alkaline Phosphatase 52 38 - 126 U/L   Total Bilirubin 0.6 0.3 - 1.2 mg/dL   GFR calc non Af Amer >60 >60 mL/min   GFR calc Af Amer >60 >60 mL/min   Anion gap 9 5 - 15  CBC  Result Value Ref Range   WBC 7.3 3.6 - 11.0 K/uL   RBC 4.39 3.80 - 5.20 MIL/uL   Hemoglobin 13.9 12.0 - 16.0 g/dL   HCT 40.1 35.0 - 47.0 %   MCV 91.2 80.0 - 100.0 fL   MCH 31.6 26.0 - 34.0 pg   MCHC 34.6 32.0 - 36.0 g/dL   RDW 13.5 11.5 - 14.5 %   Platelets 241 150 - 440 K/uL  Urinalysis, Complete w Microscopic  Result Value Ref Range   Color, Urine YELLOW (A) YELLOW   APPearance CLEAR (A) CLEAR   Specific Gravity, Urine 1.027 1.005 - 1.030   pH 6.0 5.0 - 8.0   Glucose, UA NEGATIVE NEGATIVE mg/dL   Hgb urine dipstick NEGATIVE NEGATIVE   Bilirubin Urine NEGATIVE NEGATIVE   Ketones, ur 20 (A) NEGATIVE mg/dL   Protein, ur 30 (A) NEGATIVE mg/dL   Nitrite NEGATIVE NEGATIVE   Leukocytes, UA NEGATIVE NEGATIVE   RBC / HPF 0-5 0 - 5 RBC/hpf   WBC, UA 0-5 0 - 5 WBC/hpf   Bacteria, UA RARE (A) NONE SEEN   Squamous Epithelial / LPF 0-5 (A) NONE SEEN   Mucus PRESENT   Pregnancy, urine POC  Result Value Ref Range   Preg Test, Ur NEGATIVE NEGATIVE      Assessment & Plan:   Problem List Items Addressed This Visit    Major depressive disorder, recurrent, moderate (HCC)    Clinically stable mood/depression with comorbid anxiety at this time Maintained on current med regimen No significant recent worsening stressors, seems worse in winter now improved Prior failed meds -  Paxil, Paroxetine, Prozac, Celexa, Lamotrigine  Plan - Refilled Wellbutrin XL 300mg  daily today - 90 day supply - Continue current Buspar 20mg  BID for anxiety - Continue Hydroxyzine  25mg  BID PRN - can add 3rd dose if indicated PRN -  Encourage to return to for counseling/therapy if she needs, otherwise can monitor through PCP visits - Follow-up in 4-6 months w/ new PCP here and med refill/adjust      Relevant Medications   buPROPion (WELLBUTRIN XL) 300 MG 24 hr tablet   GAD (generalized anxiety disorder) - Primary   Relevant Medications   buPROPion (WELLBUTRIN XL) 300 MG 24 hr tablet         Meds ordered this encounter  Medications  . buPROPion (WELLBUTRIN XL) 300 MG 24 hr tablet    Sig: Take 1 tablet (300 mg total) by mouth daily.    Dispense:  90 tablet    Refill:  1     Follow up plan: Return in about 4 months (around 09/21/2019) for 4-6 months with new PCP, follow-up Mood/Anxiety med refills.   11/21/2019, DO [facility] 05/24/2019, 2:05 PM

## 2019-05-24 NOTE — Assessment & Plan Note (Signed)
Clinically stable mood/depression with comorbid anxiety at this time Maintained on current med regimen No significant recent worsening stressors, seems worse in winter now improved Prior failed meds -  Paxil, Paroxetine, Prozac, Celexa, Lamotrigine  Plan - Refilled Wellbutrin XL 300mg  daily today - 90 day supply - Continue current Buspar 20mg  BID for anxiety - Continue Hydroxyzine 25mg  BID PRN - can add 3rd dose if indicated PRN - Encourage to return to [facility] for counseling/therapy if she needs, otherwise can monitor through PCP visits - Follow-up in 4-6 months w/ new PCP here and med refill/adjust

## 2019-09-01 IMAGING — CR DG CHEST 2V
1 series · 2 of 2 positions shown · non-contrast
Comparison: None

CLINICAL DATA: Shortness of breath for at least 3 weeks, recently
quit smoking

EXAM:
CHEST - 2 VIEW

[Series 1: dg chest 2 view · 0.14mm/px · 2 of 2 slices shown]
[im 1/2]
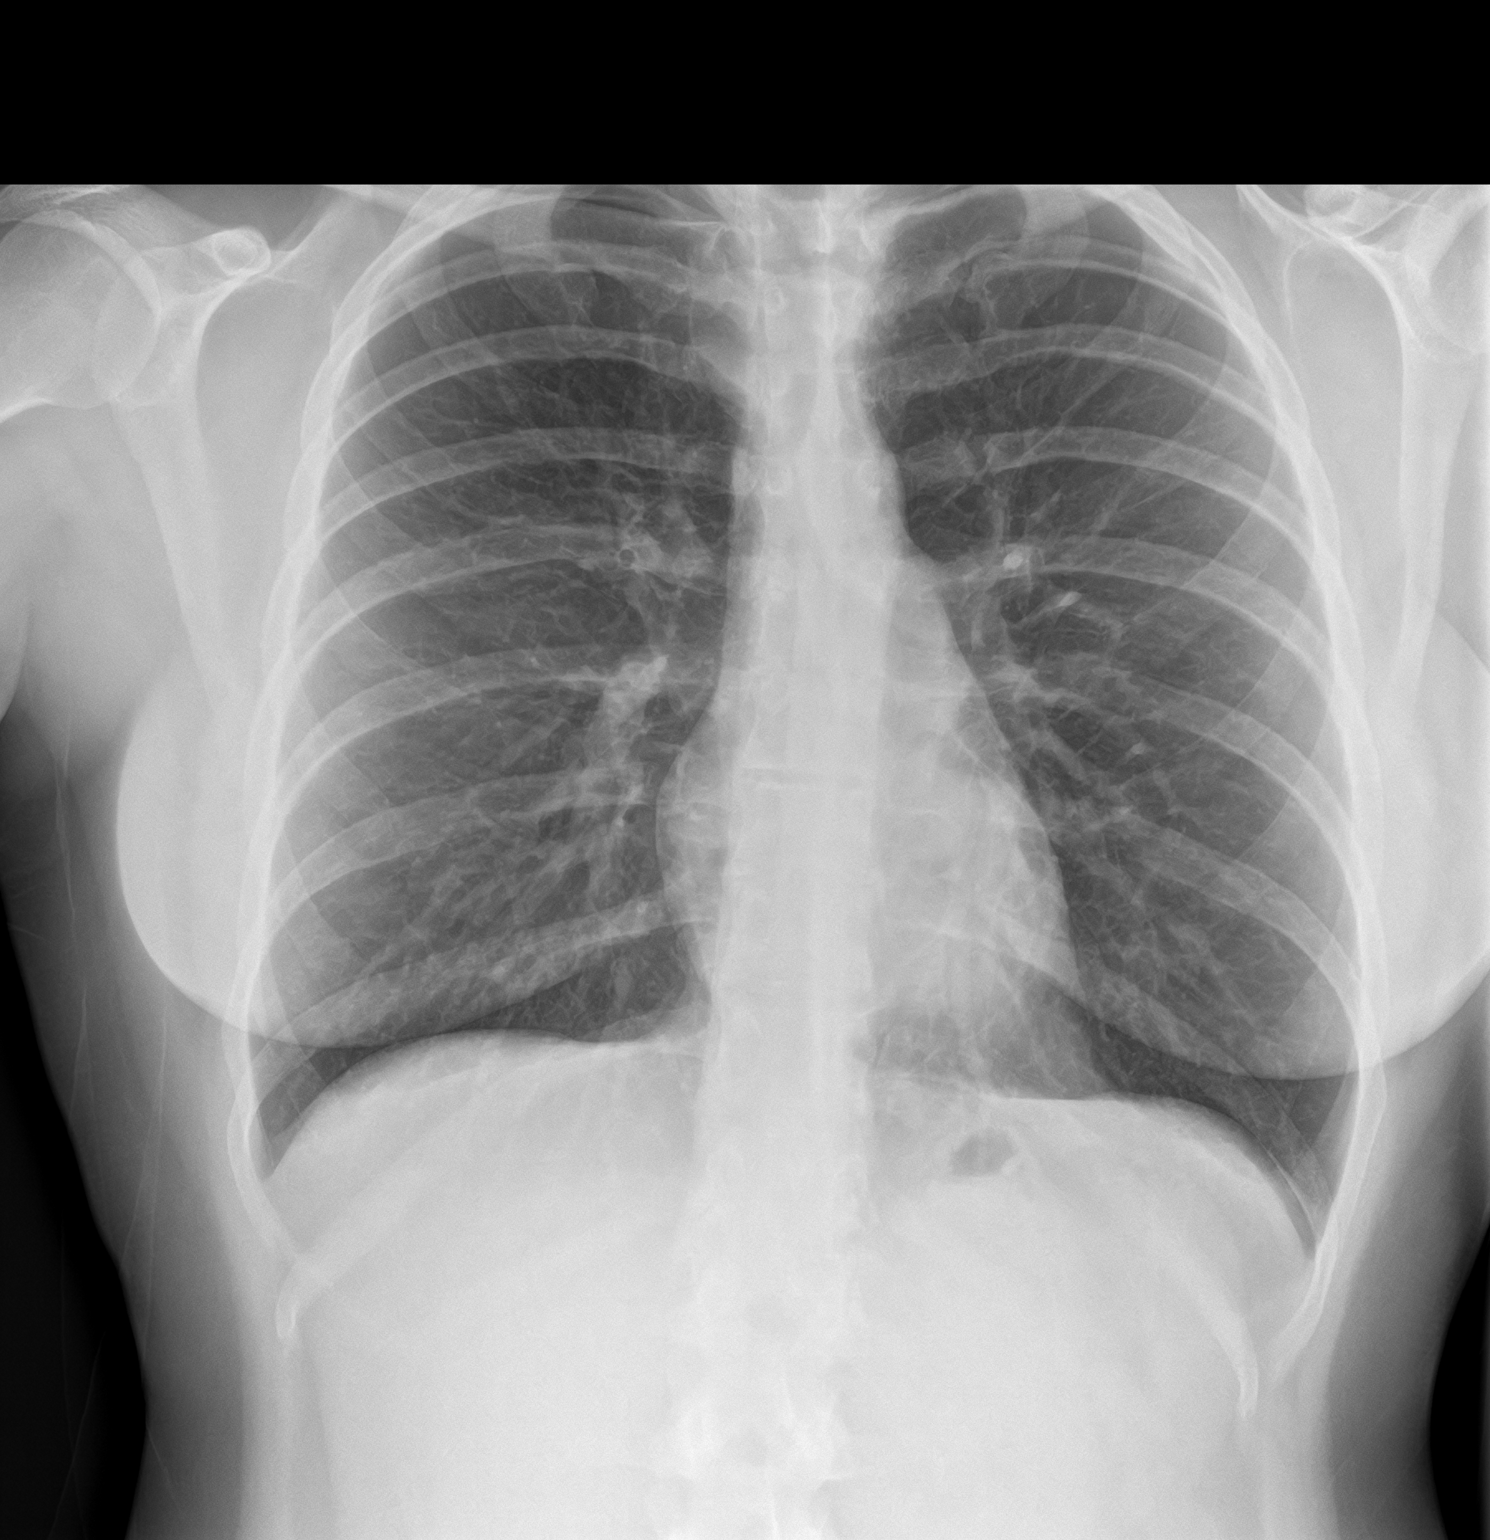
[im 2/2]
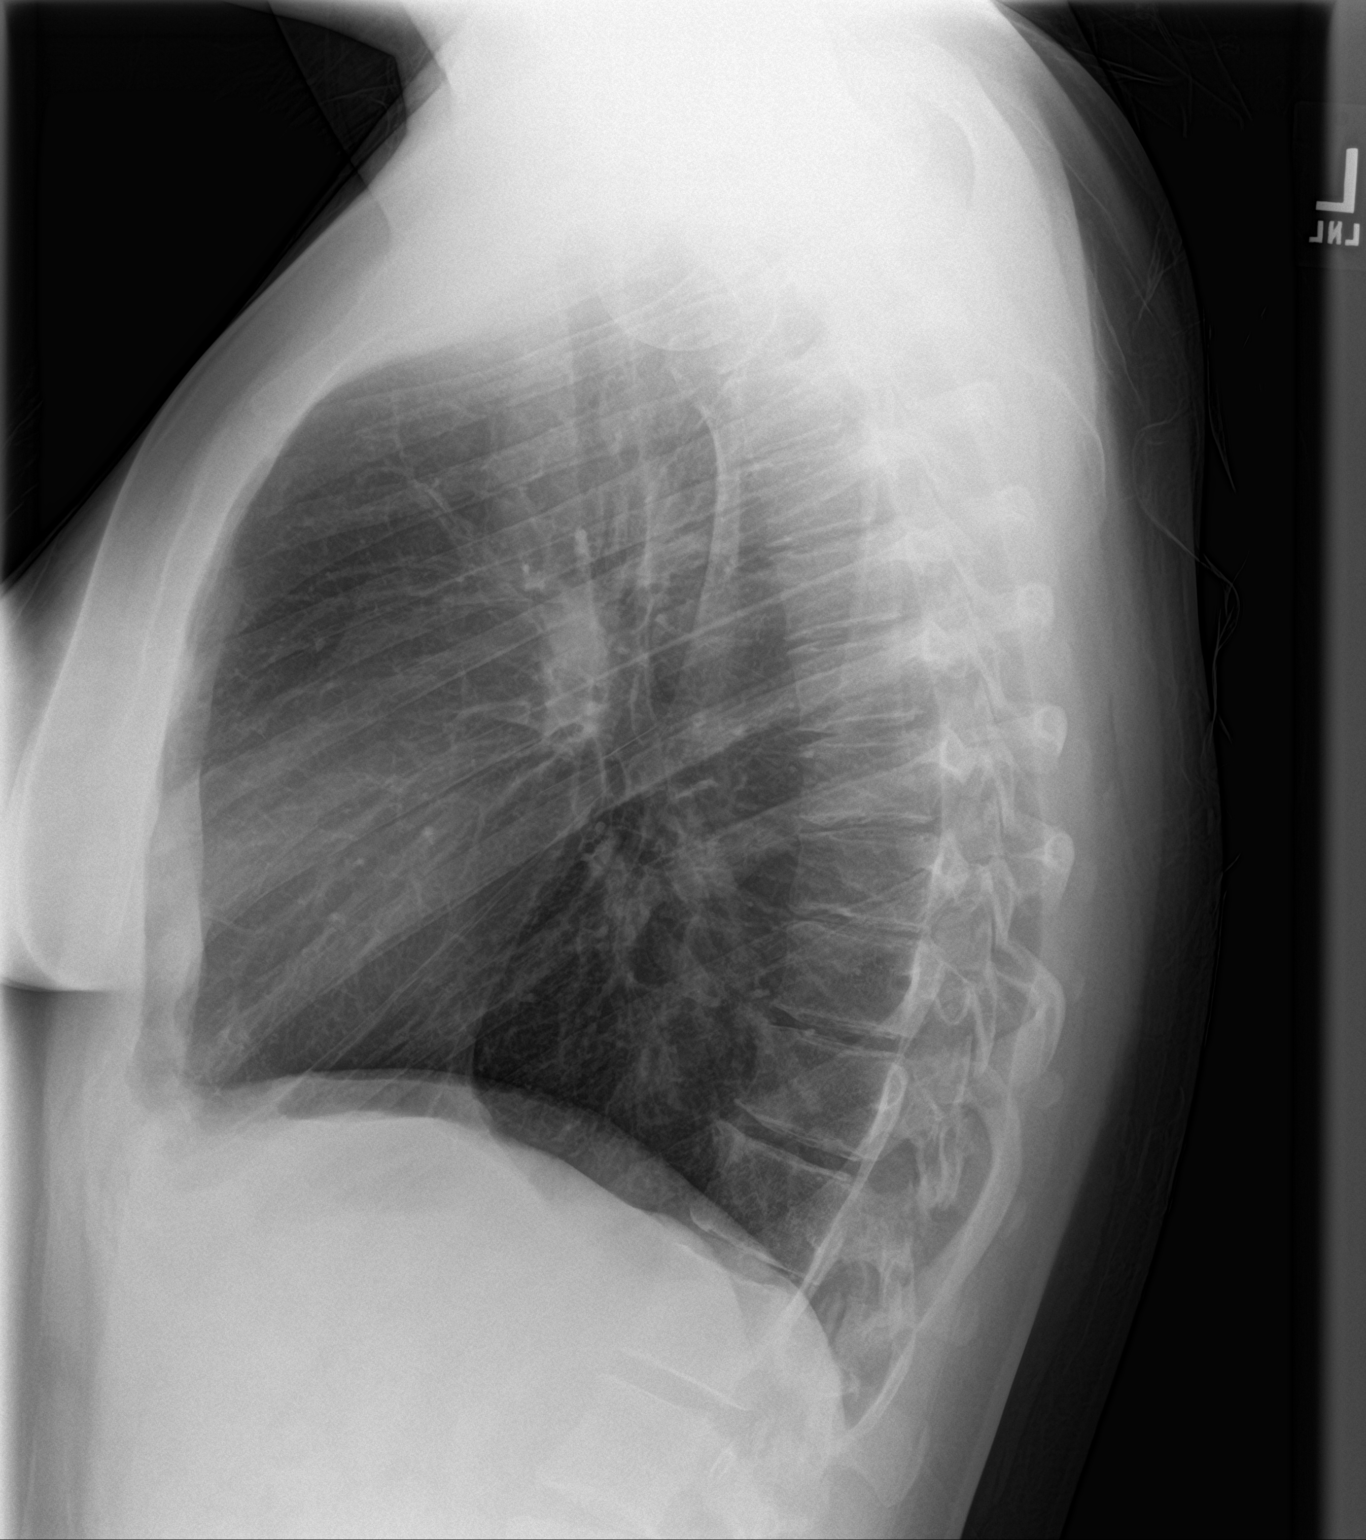

[2 of 2 positions shown; findings below may reference images not displayed]

FINDINGS: Normal heart size, mediastinal contours, and pulmonary vascularity.

Mild peribronchial thickening.

No pulmonary infiltrate, pleural effusion, or pneumothorax.

Bones unremarkable.
IMPRESSION: Mild bronchitic changes without infiltrate.

## 2019-10-11 ENCOUNTER — Other Ambulatory Visit: Payer: Self-pay | Admitting: Family Medicine

## 2019-10-11 DIAGNOSIS — F419 Anxiety disorder, unspecified: Secondary | ICD-10-CM

## 2019-11-21 ENCOUNTER — Other Ambulatory Visit: Payer: Self-pay | Admitting: Family Medicine

## 2019-11-21 DIAGNOSIS — F419 Anxiety disorder, unspecified: Secondary | ICD-10-CM

## 2019-12-05 ENCOUNTER — Other Ambulatory Visit: Payer: Self-pay | Admitting: Family Medicine

## 2019-12-05 ENCOUNTER — Other Ambulatory Visit: Payer: Self-pay | Admitting: Nurse Practitioner

## 2019-12-05 DIAGNOSIS — F331 Major depressive disorder, recurrent, moderate: Secondary | ICD-10-CM

## 2019-12-05 DIAGNOSIS — F411 Generalized anxiety disorder: Secondary | ICD-10-CM

## 2019-12-05 NOTE — Telephone Encounter (Signed)
Requested Prescriptions  Pending Prescriptions Disp Refills  . busPIRone (BUSPAR) 10 MG tablet [Pharmacy Med Name: busPIRone HCl 10 MG Oral Tablet] 120 tablet 0    Sig: Take 2 tablets by mouth twice daily     Psychiatry: Anxiolytics/Hypnotics - Non-controlled Failed - 12/05/2019 10:49 AM      Failed - Valid encounter within last 6 months    Recent Outpatient Visits          6 months ago GAD (generalized anxiety disorder)   [facility] Smitty Cords, DO   9 months ago GAD (generalized anxiety disorder)   [facility] Galen Manila, NP   1 year ago GAD (generalized anxiety disorder)   [facility] Smitty Cords, DO   1 year ago Gastroesophageal reflux disease, esophagitis presence not specified   [facility] Galen Manila, NP

## 2019-12-05 NOTE — Telephone Encounter (Signed)
Refill request for Wellbutrin; last refill 09/05/19; last visit 05/23/20; no upcoming visits noted; attempted to contact pt; left message on voicemail; 30 day courtesy refill granted; visit needed for additional refill; will route to office for notification.

## 2020-01-04 NOTE — Telephone Encounter (Signed)
FYI - Patient last visit with me 05/2019 in interval covering for [name], AGPCNP-BC. She was given refills now due for med refills and she has missed our calls I believe to schedule her.  I asked her to call and schedule with Danielle Rankin, FNP in person or virtual mychart video to do med refills and establish.  Thanks  Saralyn Pilar, DO [facility] 01/04/2020, 4:30 PM

## 2020-01-05 ENCOUNTER — Other Ambulatory Visit: Payer: Self-pay

## 2020-01-05 ENCOUNTER — Encounter: Payer: Self-pay | Admitting: Family Medicine

## 2020-01-05 ENCOUNTER — Ambulatory Visit (INDEPENDENT_AMBULATORY_CARE_PROVIDER_SITE_OTHER): Payer: Self-pay | Admitting: Family Medicine

## 2020-01-05 DIAGNOSIS — F419 Anxiety disorder, unspecified: Secondary | ICD-10-CM

## 2020-01-05 DIAGNOSIS — F411 Generalized anxiety disorder: Secondary | ICD-10-CM

## 2020-01-05 DIAGNOSIS — F331 Major depressive disorder, recurrent, moderate: Secondary | ICD-10-CM

## 2020-01-05 MED ORDER — BUPROPION HCL ER (XL) 300 MG PO TB24: 300.0000 mg | ORAL_TABLET | Freq: Every day | ORAL | 0 refills | Status: DC

## 2020-01-05 MED ORDER — HYDROXYZINE HCL 25 MG PO TABS: 25.0000 mg | ORAL_TABLET | Freq: Three times a day (TID) | ORAL | 0 refills | Status: DC | PRN

## 2020-01-05 MED ORDER — BUSPIRONE HCL 10 MG PO TABS: 20.0000 mg | ORAL_TABLET | Freq: Two times a day (BID) | ORAL | 0 refills | Status: DC

## 2020-01-05 NOTE — Assessment & Plan Note (Signed)
Reports stable and well controlled anxiety and depression with current treatment of wellbutrin XL, buspar and hydroxyzine as directed.  Requesting refill.  Discussed can provide a 30 day refill, but will need to see in office before additional refills will be provided.  Patient in agreement.  Plan: 1. Continue taking wellbutrin, buspar and hydroxyzine as directed 2. Follow up in 2-4 weeks in office for physical

## 2020-01-05 NOTE — Patient Instructions (Signed)
Continue medications as directed.  The following recommendations are helpful adjuncts for helping rebalance your mood.  Eat a nourishing diet. Ensure adequate intake of calories, protein, carbs, fat, vitamins, and minerals. Prioritize whole foods at each meal, including meats, vegetables, fruits, nuts and seeds, etc.   Avoid inflammatory and/or "junk" foods, such as sugar, omega-6 fats, refined grains, chemicals, and preservatives are common in packaged and prepared foods. Minimize or completely avoid these ingredients and stick to whole foods with little to no additives. Cook from scratch as much as possible for more control over what you eat  Get enough sleep. Poor sleep is significantly associated with depression and anxiety. Make 7-9 hours of sleep nightly a top priority  Exercise appropriately. Exercise is known to improve brain functioning and boost mood. Aim for 30 minutes of daily physical activity. Avoid "overtraining," which can cause mental disturbances  Assess your light exposure. Not enough natural light during the day and too much artificial light can have a major impact on your mood. Get outside as often as possible during daylight hours. Minimize light exposure after dark and avoid the use of electronics that give off blue light before bed  Manage your stress.  Use daily stress management techniques such as meditation, yoga, or mindfulness to retrain your brain to respond differently to stress. Try deep breathing to deactivate your "fight or flight" response.  There are many of sources with apps like Headspace, Calm or a variety of YouTube videos (videos from [employer] have guided meditation)  Prioritize your social life. Work on building social support with new friends or improve current relationships. Consider getting a pet that allows for companionship, social interaction, and physical touch. Try volunteering or joining a faith-based community to increase your sense of  purpose  4-7-8 breathing technique at bedtime: breathe in to count of 4, hold breath for count of 7, exhale for count of 8; do 3-5 times for letting go of overactive thoughts  Take time to play Unstructured "play" time can help reduce anxiety and depression Options for play include music, games, sports, dance, art, etc.  Try to add daily omega 3 fatty acids, magnesium, B complex, and balanced amino acid supplements to help improve mood and anxiety.  We will plan to see you back in 2-4 weeks for CPE and PAP testing  You will receive a survey after today's visit either digitally by e-mail or paper by USPS mail. Your experiences and feedback matter to [place].  Please respond so we know how we are doing as we provide care for you.  Call us with any questions/concerns/needs.  It is my goal to be available to you for your health concerns.  Thanks for choosing me to be a partner in your healthcare needs!  Charlaine Dalton, FNP-C Family Nurse Practitioner [facility] Phone: [phone]

## 2020-01-05 NOTE — Progress Notes (Signed)
Virtual Visit via Telephone  The purpose of this virtual visit is to provide medical care while limiting exposure to the novel coronavirus (COVID19) for both patient and office staff.  Consent was obtained for phone visit:  Yes.   Answered questions that patient had about telehealth interaction:  Yes.   I discussed the limitations, risks, security and privacy concerns of performing an evaluation and management service by telephone. I also discussed with the patient that there may be a patient responsible charge related to this service. The patient expressed understanding and agreed to proceed.  Patient is at home and is accessed via telephone Services are provided by Charlaine Dalton, FNP-C from [facility])  ---------------------------------------------------------------------- Chief Complaint  Patient presents with  . Anxiety    need refills     S: Reviewed CMA documentation. I have called patient and gathered additional HPI as follows:  Sydney Clark presents to clinic via telemedicine for a medication refill visit.  Reports that she is needing a refill on her wellbutrin XL, buspar and hydroxyzine.  Reports anxiety is stable and well controlled with these prescriptions.  Will be scheduling an appointment for in office visit in the next 2-4 weeks.  Patient is currently home Denies any high risk travel to areas of current concern for COVID19. Denies any known or suspected exposure to person with or possibly with COVID19.  Denies any fevers, chills, sweats, body ache, cough, shortness of breath, sinus pain or pressure, headache, abdominal pain, diarrhea  Past Medical History:  Diagnosis Date  . Anxiety   . Depression    Social History   Tobacco Use  . Smoking status: Former Smoker    Packs/day: 0.50    Years: 5.00    Pack years: 2.50    Types: Cigarettes    Quit date: 04/28/2018    Years since quitting: 1.6  . Smokeless tobacco: Former Clinical biochemist   . Vaping Use: Never used  Substance Use Topics  . Alcohol use: No  . Drug use: Yes    Types: Marijuana    Comment: last smoked this am    Current Outpatient Medications:  .  albuterol (VENTOLIN HFA) 108 (90 Base) MCG/ACT inhaler, Inhale 2 puffs into the lungs every 6 (six) hours as needed., Disp: 1 Inhaler, Rfl: 1 .  buPROPion (WELLBUTRIN XL) 300 MG 24 hr tablet, Take 1 tablet (300 mg total) by mouth daily., Disp: 30 tablet, Rfl: 0 .  busPIRone (BUSPAR) 10 MG tablet, Take 2 tablets (20 mg total) by mouth 2 (two) times daily., Disp: 120 tablet, Rfl: 0 .  hydrOXYzine (ATARAX/VISTARIL) 25 MG tablet, Take 1 tablet (25 mg total) by mouth 3 (three) times daily as needed., Disp: 90 tablet, Rfl: 0 .  omeprazole (PRILOSEC) 20 MG capsule, Take 1 capsule (20 mg total) by mouth daily., Disp: 90 capsule, Rfl: 1 .  cholecalciferol (VITAMIN D3) 25 MCG (1000 UT) tablet, Take 1,000 Units by mouth daily. (Patient not taking: Reported on 01/05/2020), Disp: , Rfl:  .  dimenhyDRINATE (DRAMAMINE) 50 MG tablet, Take 50 mg by mouth daily as needed. (Patient not taking: Reported on 01/05/2020), Disp: , Rfl:  .  mupirocin ointment (BACTROBAN) 2 %, APPLY OINTMENT TOPICALLY ONCE DAILY WITH DRESSING CHANGE (Patient not taking: Reported on 01/05/2020), Disp: , Rfl:   Depression screen [facility] 2/9 01/05/2020 05/24/2019 02/21/2019  Decreased Interest 2 1 0  Down, Depressed, Hopeless 1 1 1   PHQ - 2 Score 3 2 1  Altered sleeping 0 1 2  Tired, decreased energy 2 3 2   Change in appetite 0 2 3  Feeling bad or failure about yourself  1 3 2   Trouble concentrating 0 2 2  Moving slowly or fidgety/restless 2 0 0  Suicidal thoughts 0 0 0  PHQ-9 Score 8 13 12   Difficult doing work/chores Somewhat difficult Somewhat difficult Somewhat difficult    GAD 7 : Generalized Anxiety Score 01/05/2020 05/24/2019 02/21/2019 11/12/2018  Nervous, Anxious, on Edge 2 3 2 3   Control/stop worrying 2 2 2 3   Worry too much - different things 1 2 1 2    Trouble relaxing 2 1 1 2   Restless 0 0 0 2  Easily annoyed or irritable 3 3 1 1   Afraid - awful might happen 0 0 0 2  Total GAD 7 Score 10 11 7 15   Anxiety Difficulty Somewhat difficult Somewhat difficult Somewhat difficult Very difficult    -------------------------------------------------------------------------- O: No physical exam performed due to remote telephone encounter.  Physical Exam: Patient remotely monitored without video.  Verbal communication appropriate.  Cognition normal.  No results found for this or any previous visit (from the past 2160 hour(s)).  -------------------------------------------------------------------------- A&P:  Problem List Items Addressed This Visit      Other   GAD (generalized anxiety disorder)    Reports stable and well controlled anxiety and depression with current treatment of wellbutrin XL, buspar and hydroxyzine as directed.  Requesting refill.  Discussed can provide a 30 day refill, but will need to see in office before additional refills will be provided.  Patient in agreement.  Plan: 1. Continue taking wellbutrin, buspar and hydroxyzine as directed 2. Follow up in 2-4 weeks in office for physical      Relevant Medications   busPIRone (BUSPAR) 10 MG tablet   buPROPion (WELLBUTRIN XL) 300 MG 24 hr tablet   hydrOXYzine (ATARAX/VISTARIL) 25 MG tablet   Major depressive disorder, recurrent, moderate (HCC)   Relevant Medications   busPIRone (BUSPAR) 10 MG tablet   buPROPion (WELLBUTRIN XL) 300 MG 24 hr tablet   hydrOXYzine (ATARAX/VISTARIL) 25 MG tablet    Other Visit Diagnoses    Anxiety       Relevant Medications   busPIRone (BUSPAR) 10 MG tablet   buPROPion (WELLBUTRIN XL) 300 MG 24 hr tablet   hydrOXYzine (ATARAX/VISTARIL) 25 MG tablet      Meds ordered this encounter  Medications  . busPIRone (BUSPAR) 10 MG tablet    Sig: Take 2 tablets (20 mg total) by mouth 2 (two) times daily.    Dispense:  120 tablet    Refill:   0  . buPROPion (WELLBUTRIN XL) 300 MG 24 hr tablet    Sig: Take 1 tablet (300 mg total) by mouth daily.    Dispense:  30 tablet    Refill:  0    30 day courtesy refill; visit needed for additional refills.  . hydrOXYzine (ATARAX/VISTARIL) 25 MG tablet    Sig: Take 1 tablet (25 mg total) by mouth 3 (three) times daily as needed.    Dispense:  90 tablet    Refill:  0    Follow-up: - Return in 2-4 weeks for physical and PAP testing  Patient verbalizes understanding with the above medical recommendations including the limitation of remote medical advice.  Specific follow-up and call-back criteria were given for patient to follow-up or seek medical care more urgently if needed.  - Time spent in direct consultation with patient on phone:  5 minutes  Harlin Rain, FNP-C [facility] 01/05/2020, 11:48 AM

## 2020-01-11 ENCOUNTER — Encounter: Payer: Self-pay | Admitting: Family Medicine

## 2020-01-24 ENCOUNTER — Ambulatory Visit (INDEPENDENT_AMBULATORY_CARE_PROVIDER_SITE_OTHER): Payer: Self-pay | Admitting: Family Medicine

## 2020-01-24 ENCOUNTER — Encounter: Payer: Self-pay | Admitting: Family Medicine

## 2020-01-24 ENCOUNTER — Other Ambulatory Visit: Payer: Self-pay

## 2020-01-24 VITALS — BP 106/59 | HR 114 | Temp 97.3°F | Ht 65.5 in | Wt 195.8 lb

## 2020-01-24 DIAGNOSIS — Z0000 Encounter for general adult medical examination without abnormal findings: Secondary | ICD-10-CM | POA: Insufficient documentation

## 2020-01-24 NOTE — Progress Notes (Signed)
Subjective:    Patient ID: Sydney Clark, female    DOB: February 18, 1996, 24 y.o.   MRN: [id number]  Sydney Clark is a 24 y.o. female presenting on 01/24/2020 for Annual Exam   HPI  HEALTH MAINTENANCE:  Weight/BMI: Obese, BMI 32.09 Physical activity: Stays active Diet: Regular Seatbelt: Always Sunscreen: As needed PAP: Requesting to complete at health department due to lack of insurance coverage HIV & Hep C Screening: Offered and declined GC/CT: Offered and declined Optometry: Regularly Dentistry: > 10 years  IMMUNIZATIONS: Influenza: Due next season Tetanus: Due, discussed COVID: Discussed  Depression screen [facility] 2/9 01/24/2020 01/05/2020 05/24/2019  Decreased Interest 0 2 1  Down, Depressed, Hopeless 1 1 1   PHQ - 2 Score 1 3 2   Altered sleeping 2 0 1  Tired, decreased energy 2 2 3   Change in appetite 0 0 2  Feeling bad or failure about yourself  1 1 3   Trouble concentrating 2 0 2  Moving slowly or fidgety/restless 0 2 0  Suicidal thoughts 0 0 0  PHQ-9 Score 8 8 13   Difficult doing work/chores Somewhat difficult Somewhat difficult Somewhat difficult    Past Medical History:  Diagnosis Date  . Anxiety   . Depression    Past Surgical History:  Procedure Laterality Date  . ADENOIDECTOMY    . MYRINGOTOMY WITH TUBE PLACEMENT    . TONSILLECTOMY     Social History   Socioeconomic History  . Marital status:    Spouse name: Not on file  . Number of children: 0  . Years of education: Not on file  . Highest education level: 10th grade  Occupational History  . Not on file  Tobacco Use  . Smoking status: Former Smoker    Packs/day: 0.50    Years: 5.00    Pack years: 2.50    Types: Cigarettes    Quit date: 04/28/2018    Years since quitting: 1.7  . Smokeless tobacco: Former  . Vaping Use: Every day  . Substances: Nicotine, Flavoring  Substance and Sexual Activity  . Alcohol use: No  . Drug use: Yes    Types: Marijuana     Comment: last smoked this am  . Sexual activity: Yes    Birth control/protection: None  Other Topics Concern  . Not on file  Social History Narrative  . Not on file   Social Determinants of Health   Financial Resource Strain:   . Difficulty of Paying Living Expenses:   Food Insecurity:   . Worried About in the Last Year:   . in the Last Year:   Transportation Needs:   . Media planner (Medical):   06/28/2018 Lack of Transportation (Non-Medical):   Physical Activity:   . Days of Exercise per Week:   . Minutes of Exercise per Session:   Stress:   . Feeling of Stress :   Social Connections:   . Frequency of Communication with Friends and Family:   . Frequency of Social Gatherings with Friends and Family:   . Attends Religious Services:   . Active Member of Clubs or Organizations:   . Attends Clinical biochemist Meetings:   Programme researcher, broadcasting/film/video Marital Status:   Intimate Partner Violence:   . Fear of Current or Ex-Partner:   . Emotionally Abused:   Barista Physically Abused:   . Sexually Abused:    Family History  Problem Relation Age of Onset  .  Cirrhosis Mother   . Alcohol abuse Mother   . Depression Mother   . Alcohol abuse Father   . Anxiety disorder Sister   . Cancer Neg Hx   . Heart attack Neg Hx   . Stroke Neg Hx    Current Outpatient Medications on File Prior to Visit  Medication Sig  . albuterol (VENTOLIN HFA) 108 (90 Base) MCG/ACT inhaler Inhale 2 puffs into the lungs every 6 (six) hours as needed.  Marland Kitchen. buPROPion (WELLBUTRIN XL) 300 MG 24 hr tablet Take 1 tablet (300 mg total) by mouth daily.  . busPIRone (BUSPAR) 10 MG tablet Take 2 tablets (20 mg total) by mouth 2 (two) times daily.  Marland Kitchen. dimenhyDRINATE (DRAMAMINE) 50 MG tablet Take 50 mg by mouth daily as needed.   . hydrOXYzine (ATARAX/VISTARIL) 25 MG tablet Take 1 tablet (25 mg total) by mouth 3 (three) times daily as needed.  Marland Kitchen. omeprazole (PRILOSEC) 20 MG capsule Take 1 capsule (20 mg total) by  mouth daily.  . cholecalciferol (VITAMIN D3) 25 MCG (1000 UT) tablet Take 1,000 Units by mouth daily. (Patient not taking: Reported on 01/05/2020)   No current facility-administered medications on file prior to visit.    Per HPI unless specifically indicated above     Objective:    BP (!) 106/59 (BP Location: Left Arm, Patient Position: Sitting, Cuff Size: Normal)   Pulse (!) 114   Temp (!) 97.3 F (36.3 C) (Temporal)   Ht 5' 5.5" (1.664 m)   Wt 195 lb 12.8 oz (88.8 kg)   LMP 01/10/2020   SpO2 99%   BMI 32.09 kg/m   Wt Readings from Last 3 Encounters:  01/24/20 195 lb 12.8 oz (88.8 kg)  05/24/19 196 lb (88.9 kg)  02/21/19 200 lb 12.8 oz (91.1 kg)   Physical Exam Vitals reviewed.  Constitutional:      General: She is not in acute distress.    Appearance: Normal appearance. She is well-developed and well-groomed. She is obese. She is not ill-appearing or toxic-appearing.  HENT:     Head: Normocephalic and atraumatic.     Right Ear: External ear normal. There is no impacted cerumen.     Left Ear: External ear normal. There is no impacted cerumen.     Ears:     Comments: Otoscope unavailable in clinic at time of exam, unable to visualize ear canals or TMs    Nose: Nose normal. No congestion or rhinorrhea.     Mouth/Throat:     Lips: Pink.     Mouth: Mucous membranes are moist.     Pharynx: Oropharynx is clear. Uvula midline. No oropharyngeal exudate or posterior oropharyngeal erythema.  Eyes:     General: Lids are normal. Vision grossly intact. No scleral icterus.       Right eye: No discharge.        Left eye: No discharge.     Extraocular Movements: Extraocular movements intact.     Conjunctiva/sclera: Conjunctivae normal.     Pupils: Pupils are equal, round, and reactive to light.  Neck:     Thyroid: No thyroid mass or thyromegaly.  Cardiovascular:     Rate and Rhythm: Normal rate and regular rhythm.     Pulses: Normal pulses.          Dorsalis pedis pulses are 2+  on the right side and 2+ on the left side.     Heart sounds: Normal heart sounds. No murmur heard.  No friction rub. No gallop.  Pulmonary:     Effort: Pulmonary effort is normal. No respiratory distress.     Breath sounds: Normal breath sounds.  Abdominal:     General: Abdomen is flat. Bowel sounds are normal. There is no distension.     Palpations: Abdomen is soft. There is no hepatomegaly, splenomegaly or mass.     Tenderness: There is no abdominal tenderness. There is no guarding or rebound.     Hernia: No hernia is present.  Musculoskeletal:        General: Normal range of motion.     Cervical back: Normal range of motion and neck supple. No tenderness.     Right lower leg: No edema.     Left lower leg: No edema.     Comments: Normal tone, strength 5/5 BUE & BLE  Feet:     Right foot:     Skin integrity: Skin integrity normal.     Left foot:     Skin integrity: Skin integrity normal.  Lymphadenopathy:     Cervical: No cervical adenopathy.  Skin:    General: Skin is warm and dry.     Capillary Refill: Capillary refill takes less than 2 seconds.  Neurological:     General: No focal deficit present.     Mental Status: She is alert and oriented to person, place, and time.     Cranial Nerves: No cranial nerve deficit.     Sensory: No sensory deficit.     Motor: No weakness.     Coordination: Coordination normal.     Gait: Gait normal.     Deep Tendon Reflexes: Reflexes normal.  Psychiatric:        Attention and Perception: Attention and perception normal.        Mood and Affect: Mood and affect normal.        Speech: Speech normal.        Behavior: Behavior normal. Behavior is cooperative.        Thought Content: Thought content normal.        Cognition and Memory: Cognition and memory normal.        Judgment: Judgment normal.     Results for orders placed or performed during the hospital encounter of 08/16/16  Lipase, blood  Result Value Ref Range   Lipase 21 11 -  51 U/L  Comprehensive metabolic panel  Result Value Ref Range   Sodium 137 135 - 145 mmol/L   Potassium 3.7 3.5 - 5.1 mmol/L   Chloride 106 101 - 111 mmol/L   CO2 22 22 - 32 mmol/L   Glucose, Bld 92 65 - 99 mg/dL   BUN 13 6 - 20 mg/dL   Creatinine, Ser 0.17 0.44 - 1.00 mg/dL   Calcium 9.4 8.9 - 51.0 mg/dL   Total Protein 8.3 (H) 6.5 - 8.1 g/dL   Albumin 5.1 (H) 3.5 - 5.0 g/dL   AST 24 15 - 41 U/L   ALT 23 14 - 54 U/L   Alkaline Phosphatase 52 38 - 126 U/L   Total Bilirubin 0.6 0.3 - 1.2 mg/dL   GFR calc non Af Amer >60 >60 mL/min   GFR calc Af Amer >60 >60 mL/min   Anion gap 9 5 - 15  CBC  Result Value Ref Range   WBC 7.3 3.6 - 11.0 K/uL   RBC 4.39 3.80 - 5.20 MIL/uL   Hemoglobin 13.9 12.0 - 16.0 g/dL   HCT 25.8 35 - 47 %   MCV 91.2  80.0 - 100.0 fL   MCH 31.6 26.0 - 34.0 pg   MCHC 34.6 32.0 - 36.0 g/dL   RDW 66.2 94.7 - 65.4 %   Platelets 241 150 - 440 K/uL  Urinalysis, Complete w Microscopic  Result Value Ref Range   Color, Urine YELLOW (A) YELLOW   APPearance CLEAR (A) CLEAR   Specific Gravity, Urine 1.027 1.005 - 1.030   pH 6.0 5.0 - 8.0   Glucose, UA NEGATIVE NEGATIVE mg/dL   Hgb urine dipstick NEGATIVE NEGATIVE   Bilirubin Urine NEGATIVE NEGATIVE   Ketones, ur 20 (A) NEGATIVE mg/dL   Protein, ur 30 (A) NEGATIVE mg/dL   Nitrite NEGATIVE NEGATIVE   Leukocytes, UA NEGATIVE NEGATIVE   RBC / HPF 0-5 0 - 5 RBC/hpf   WBC, UA 0-5 0 - 5 WBC/hpf   Bacteria, UA RARE (A) NONE SEEN   Squamous Epithelial / LPF 0-5 (A) NONE SEEN   Mucus PRESENT   Pregnancy, urine POC  Result Value Ref Range   Preg Test, Ur NEGATIVE NEGATIVE      Assessment & Plan:   Problem List Items Addressed This Visit      Other   Routine medical exam - Primary    Annual physical exam without new findings.  Well adult with no acute concerns.  Plan: 1. Obtain health maintenance screenings as above according to age. - Increase physical activity to 30 minutes most days of the week.  - Eat  healthy diet high in vegetables and fruits; low in refined carbohydrates. - Screening labs and tests as ordered 2. Return 1 year for annual physical.       Relevant Orders   POCT Urinalysis Dipstick   CBC with Differential   COMPLETE METABOLIC PANEL WITH GFR   Lipid Profile   Thyroid Panel With TSH      No orders of the defined types were placed in this encounter.     Follow up plan: Return in about 1 year (around 01/23/2021) for Physical.  Charlaine Dalton, FNP-C Family Nurse Practitioner [facility] 01/24/2020, 10:47 AM

## 2020-01-24 NOTE — Patient Instructions (Signed)
Well Visit: Care Instructions Overview  Well visits can help you stay healthy. Your provider has checked your overall health and may have suggested ways to take good care of yourself. Your provider also may have recommended tests. At home, you can help prevent illness with healthy eating, regular exercise, and other steps.  Follow-up care is a key part of your treatment and safety. Be sure to make and go to all appointments, and call your provider if you are having problems. It's also a good idea to know your test results and keep a list of the medicines you take.  How can you care for yourself at home?   Get screening tests that you and your doctor decide on. Screening helps find diseases before any symptoms appear.   Eat healthy foods. Choose fruits, vegetables, whole grains, protein, and low-fat dairy foods. Limit fat, especially saturated fat. Reduce salt in your diet.   Limit alcohol. If you are a man, have no more than 2 drinks a day or 14 drinks a week. If you are a woman, have no more than 1 drink a day or 7 drinks a week.   Get at least 30 minutes of physical activity on most days of the week.  We recommend you go no more than 2 days in a row without exercise. Walking is a good choice. You also may want to do other activities, such as running, swimming, cycling, or playing tennis or team sports. Discuss any changes in your exercise program with your provider.   Reach and stay at a healthy weight. This will lower your risk for many problems, such as obesity, diabetes, heart disease, and high blood pressure.   Do not smoke or allow others to smoke around you. If you need help quitting, talk to your provider about stop-smoking programs and medicines. These can increase your chances of quitting for good.  Can call 1-800-QUIT-NOW (431-730-9947) for the [facility], assistance with smoking cessation.   Care for your mental health. It is easy to get weighed down by worry  and stress. Learn strategies to manage stress, like deep breathing and mindfulness, and stay connected with your family and community. If you find you often feel sad or hopeless, talk with your provider. Treatment can help.   Talk to your provider about whether you have any risk factors for sexually transmitted infections (STIs). You can help prevent STIs if you wait to have sex with a new partner (or partners) until you've each been tested for STIs. It also helps if you use condoms (female or female condoms) and if you limit your sex partners to one person who only has sex with you. Vaccines are available for some STIs, such as HPV (these are age dependent).   Use birth control if it's important to you to prevent pregnancy. Talk with your provider about the choices available and what might be best for you.   If you think you may have a problem with alcohol or drug use, talk to your provider. This includes prescription medicines (such as amphetamines and opioids) and illegal drugs (such as cocaine and methamphetamine). Your provider can help you figure out what type of treatment is best for you.   If you have concerns about domestic violence or intimate partner violence, there are resources available to you. National Domestic Abuse Hotline (604)386-2492   Protect your skin from too much sun. When you're outdoors from 10 a.m. to 4 p.m., stay in the shade or cover up  with clothing and a hat with a wide brim. Wear sunglasses that block UV rays. Even when it's cloudy, put broad-spectrum sunscreen (SPF 30 or higher) on any exposed skin.   See a dentist one or two times a year for checkups and to have your teeth cleaned.   See an eye doctor once per year for an eye exam.   Wear a seat belt in the car.  When should you call for help?  Watch closely for changes in your health, and be sure to contact your provider if you have any problems or symptoms that concern you.  We will plan to see you back  in 1 year for physical  You will receive a survey after today's visit either digitally by e-mail or paper by USPS mail. Your experiences and feedback matter to [place].  Please respond so we know how we are doing as we provide care for you.  Call us with any questions/concerns/needs.  It is my goal to be available to you for your health concerns.  Thanks for choosing me to be a partner in your healthcare needs!  Charlaine Dalton, FNP-C Family Nurse Practitioner [facility] Phone: [phone]

## 2020-01-24 NOTE — Assessment & Plan Note (Signed)
Annual physical exam without new findings.  Well adult with no acute concerns.  Plan: 1. Obtain health maintenance screenings as above according to age. - Increase physical activity to 30 minutes most days of the week.  - Eat healthy diet high in vegetables and fruits; low in refined carbohydrates. - Screening labs and tests as ordered 2. Return 1 year for annual physical.  

## 2020-01-25 LAB — CBC WITH DIFFERENTIAL/PLATELET
Absolute Monocytes: 390 cells/uL (ref 200–950)
Basophils Absolute: 32 cells/uL (ref 0–200)
Basophils Relative: 0.5 %
Eosinophils Absolute: 211 cells/uL (ref 15–500)
Eosinophils Relative: 3.3 %
HCT: 41.1 % (ref 35.0–45.0)
Hemoglobin: 13.3 g/dL (ref 11.7–15.5)
Lymphs Abs: 2330 cells/uL (ref 850–3900)
MCH: 30.2 pg (ref 27.0–33.0)
MCHC: 32.4 g/dL (ref 32.0–36.0)
MCV: 93.2 fL (ref 80.0–100.0)
MPV: 9 fL (ref 7.5–12.5)
Monocytes Relative: 6.1 %
Neutro Abs: 3437 cells/uL (ref 1500–7800)
Neutrophils Relative %: 53.7 %
Platelets: 259 10*3/uL (ref 140–400)
RBC: 4.41 10*6/uL (ref 3.80–5.10)
RDW: 12.7 % (ref 11.0–15.0)
Total Lymphocyte: 36.4 %
WBC: 6.4 10*3/uL (ref 3.8–10.8)

## 2020-01-25 LAB — COMPLETE METABOLIC PANEL WITH GFR
AG Ratio: 2.1 (calc) (ref 1.0–2.5)
ALT: 30 U/L — ABNORMAL HIGH (ref 6–29)
AST: 24 U/L (ref 10–30)
Albumin: 4.9 g/dL (ref 3.6–5.1)
Alkaline phosphatase (APISO): 48 U/L (ref 31–125)
BUN: 16 mg/dL (ref 7–25)
CO2: 25 mmol/L (ref 20–32)
Calcium: 9.7 mg/dL (ref 8.6–10.2)
Chloride: 110 mmol/L (ref 98–110)
Creat: 0.92 mg/dL (ref 0.50–1.10)
GFR, Est African American: 100 mL/min/{1.73_m2} (ref >=60)
GFR, Est Non African American: 87 mL/min/{1.73_m2} (ref >=60)
Globulin: 2.3 g/dL (calc) (ref 1.9–3.7)
Glucose, Bld: 92 mg/dL (ref 65–99)
Potassium: 4.5 mmol/L (ref 3.5–5.3)
Sodium: 139 mmol/L (ref 135–146)
Total Bilirubin: 0.6 mg/dL (ref 0.2–1.2)
Total Protein: 7.2 g/dL (ref 6.1–8.1)

## 2020-01-25 LAB — LIPID PANEL
Cholesterol: 194 mg/dL (ref <200)
HDL: 44 mg/dL — ABNORMAL LOW (ref >=50)
LDL Cholesterol (Calc): 119 mg/dL (calc) — ABNORMAL HIGH
Non-HDL Cholesterol (Calc): 150 mg/dL (calc) — ABNORMAL HIGH (ref <130)
Total CHOL/HDL Ratio: 4.4 (calc) (ref <5.0)
Triglycerides: 193 mg/dL — ABNORMAL HIGH (ref <150)

## 2020-01-25 LAB — THYROID PANEL WITH TSH
Free Thyroxine Index: 2.2 (ref 1.4–3.8)
T3 Uptake: 29 % (ref 22–35)
T4, Total: 7.7 ug/dL (ref 5.1–11.9)
TSH: 0.94 mIU/L

## 2020-01-26 ENCOUNTER — Encounter: Payer: Self-pay | Admitting: Family Medicine

## 2020-02-07 ENCOUNTER — Other Ambulatory Visit: Payer: Self-pay | Admitting: Nurse Practitioner

## 2020-02-07 ENCOUNTER — Other Ambulatory Visit: Payer: Self-pay | Admitting: Family Medicine

## 2020-02-07 ENCOUNTER — Encounter: Payer: Self-pay | Admitting: Family Medicine

## 2020-02-07 DIAGNOSIS — F331 Major depressive disorder, recurrent, moderate: Secondary | ICD-10-CM

## 2020-02-07 DIAGNOSIS — F419 Anxiety disorder, unspecified: Secondary | ICD-10-CM

## 2020-02-07 DIAGNOSIS — F411 Generalized anxiety disorder: Secondary | ICD-10-CM

## 2020-02-07 NOTE — Telephone Encounter (Signed)
Requested Prescriptions  Pending Prescriptions Disp Refills  . hydrOXYzine (ATARAX/VISTARIL) 25 MG tablet [Pharmacy Med Name: hydrOXYzine HCl 25 MG Oral Tablet] 90 tablet 0    Sig: Take 1 tablet by mouth three times daily as needed     Ear, Nose, and Throat:  Antihistamines Passed - 02/07/2020 11:24 AM      Passed - Valid encounter within last 12 months    Recent Outpatient Visits          2 weeks ago Routine medical exam   [facility], Jodelle Gross, FNP   1 month ago GAD (generalized anxiety disorder)   [facility], Jodelle Gross, FNP   8 months ago GAD (generalized anxiety disorder)   [facility] Smitty Cords, DO   11 months ago GAD (generalized anxiety disorder)   [facility] Galen Manila, NP   1 year ago GAD (generalized anxiety disorder)   [facility], Netta Neat, DO

## 2020-02-07 NOTE — Telephone Encounter (Signed)
Requested Prescriptions  Pending Prescriptions Disp Refills   buPROPion (WELLBUTRIN XL) 300 MG 24 hr tablet [Pharmacy Med Name: buPROPion HCl ER (XL) 300 MG Oral Tablet Extended Release 24 Hour] 90 tablet 1    Sig: Take 1 tablet by mouth once daily     Psychiatry: Antidepressants - bupropion Passed - 02/07/2020 11:24 AM      Passed - Completed PHQ-2 or PHQ-9 in the last 360 days.      Passed - Last BP in normal range    BP Readings from Last 1 Encounters:  01/24/20 (!) 106/59         Passed - Valid encounter within last 6 months    Recent Outpatient Visits          2 weeks ago Routine medical exam   [facility], Jodelle Gross, FNP   1 month ago GAD (generalized anxiety disorder)   [facility], Jodelle Gross, FNP   8 months ago GAD (generalized anxiety disorder)   [facility] Smitty Cords, DO   11 months ago GAD (generalized anxiety disorder)   [facility] Galen Manila, NP   1 year ago GAD (generalized anxiety disorder)   [facility], Netta Neat, DO

## 2020-02-27 ENCOUNTER — Encounter: Payer: Self-pay | Admitting: Family Medicine

## 2020-02-28 NOTE — Telephone Encounter (Signed)
I attempted to contact the patient, no answer. LMOM to return my call. I also sent a mychart message to the patient.

## 2020-03-20 ENCOUNTER — Other Ambulatory Visit: Payer: Self-pay | Admitting: Family Medicine

## 2020-03-20 DIAGNOSIS — F419 Anxiety disorder, unspecified: Secondary | ICD-10-CM

## 2020-03-20 NOTE — Telephone Encounter (Signed)
Requested Prescriptions  Pending Prescriptions Disp Refills  . hydrOXYzine (ATARAX/VISTARIL) 25 MG tablet [Pharmacy Med Name: hydrOXYzine HCl 25 MG Oral Tablet] 90 tablet 0    Sig: Take 1 tablet by mouth three times daily as needed     Ear, Nose, and Throat:  Antihistamines Passed - 03/20/2020 11:16 AM      Passed - Valid encounter within last 12 months    Recent Outpatient Visits          1 month ago Routine medical exam   [facility], Jodelle Gross, FNP   2 months ago GAD (generalized anxiety disorder)   [facility], Jodelle Gross, FNP   10 months ago GAD (generalized anxiety disorder)   [facility] Smitty Cords, DO   1 year ago GAD (generalized anxiety disorder)   [facility] Galen Manila, NP   1 year ago GAD (generalized anxiety disorder)   [facility], Netta Neat, DO

## 2020-04-22 ENCOUNTER — Other Ambulatory Visit: Payer: Self-pay | Admitting: Family Medicine

## 2020-04-22 DIAGNOSIS — F419 Anxiety disorder, unspecified: Secondary | ICD-10-CM

## 2020-04-22 NOTE — Telephone Encounter (Signed)
Requested Prescriptions  Pending Prescriptions Disp Refills  . hydrOXYzine (ATARAX/VISTARIL) 25 MG tablet [Pharmacy Med Name: hydrOXYzine HCl 25 MG Oral Tablet] 90 tablet 0    Sig: Take 1 tablet by mouth three times daily as needed     Ear, Nose, and Throat:  Antihistamines Passed - 04/22/2020 12:14 PM      Passed - Valid encounter within last 12 months    Recent Outpatient Visits          2 months ago Routine medical exam   [facility], Jodelle Gross, FNP   3 months ago GAD (generalized anxiety disorder)   [facility], Jodelle Gross, FNP   11 months ago GAD (generalized anxiety disorder)   [facility] Smitty Cords, DO   1 year ago GAD (generalized anxiety disorder)   [facility] Galen Manila, NP   1 year ago GAD (generalized anxiety disorder)   [facility], Netta Neat, DO

## 2020-06-06 ENCOUNTER — Other Ambulatory Visit: Payer: Self-pay | Admitting: Family Medicine

## 2020-06-06 DIAGNOSIS — F419 Anxiety disorder, unspecified: Secondary | ICD-10-CM

## 2020-07-07 ENCOUNTER — Other Ambulatory Visit: Payer: Self-pay | Admitting: Family Medicine

## 2020-07-07 DIAGNOSIS — J9801 Acute bronchospasm: Secondary | ICD-10-CM

## 2020-07-07 NOTE — Telephone Encounter (Signed)
Requested Prescriptions  Pending Prescriptions Disp Refills   albuterol (VENTOLIN HFA) 108 (90 Base) MCG/ACT inhaler [Pharmacy Med Name: ALBUTEROL HFA (PROAIR) INHALER] 8.5 each 0    Sig: TAKE 2 PUFFS BY MOUTH INTO THE LUNGS EVERY 6 HOURS AS NEEDED     Pulmonology:  Beta Agonists Failed - 07/07/2020  5:43 PM      Failed - One inhaler should last at least one month. If the patient is requesting refills earlier, contact the patient to check for uncontrolled symptoms.      Passed - Valid encounter within last 12 months    Recent Outpatient Visits          5 months ago Routine medical exam   [facility], Jodelle Gross, FNP   6 months ago GAD (generalized anxiety disorder)   [facility], Jodelle Gross, FNP   1 year ago GAD (generalized anxiety disorder)   [facility] Smitty Cords, DO   1 year ago GAD (generalized anxiety disorder)   [facility] Galen Manila, NP   1 year ago GAD (generalized anxiety disorder)   [facility], Netta Neat, DO

## 2020-07-21 ENCOUNTER — Other Ambulatory Visit: Payer: Self-pay | Admitting: Family Medicine

## 2020-07-21 DIAGNOSIS — F419 Anxiety disorder, unspecified: Secondary | ICD-10-CM

## 2020-07-25 ENCOUNTER — Encounter: Payer: Self-pay | Admitting: Family Medicine

## 2020-07-26 NOTE — Telephone Encounter (Signed)
OV 07/27/2020

## 2020-07-27 ENCOUNTER — Encounter: Payer: Self-pay | Admitting: Family Medicine

## 2020-07-27 ENCOUNTER — Other Ambulatory Visit: Payer: Self-pay

## 2020-07-27 ENCOUNTER — Ambulatory Visit (INDEPENDENT_AMBULATORY_CARE_PROVIDER_SITE_OTHER): Payer: Self-pay | Admitting: Family Medicine

## 2020-07-27 VITALS — BP 122/67 | HR 104 | Temp 98.0°F | Resp 18 | Ht 65.5 in | Wt 180.0 lb

## 2020-07-27 DIAGNOSIS — F419 Anxiety disorder, unspecified: Secondary | ICD-10-CM

## 2020-07-27 DIAGNOSIS — H6691 Otitis media, unspecified, right ear: Secondary | ICD-10-CM

## 2020-07-27 DIAGNOSIS — F411 Generalized anxiety disorder: Secondary | ICD-10-CM

## 2020-07-27 DIAGNOSIS — F32A Depression, unspecified: Secondary | ICD-10-CM | POA: Insufficient documentation

## 2020-07-27 DIAGNOSIS — F331 Major depressive disorder, recurrent, moderate: Secondary | ICD-10-CM

## 2020-07-27 MED ORDER — BUPROPION HCL ER (XL) 300 MG PO TB24: 300.0000 mg | ORAL_TABLET | Freq: Every day | ORAL | 1 refills | Status: DC

## 2020-07-27 MED ORDER — HYDROXYZINE HCL 25 MG PO TABS: 25.0000 mg | ORAL_TABLET | Freq: Three times a day (TID) | ORAL | 0 refills | Status: DC | PRN

## 2020-07-27 MED ORDER — AMOXICILLIN-POT CLAVULANATE 875-125 MG PO TABS: 1.0000 | ORAL_TABLET | Freq: Two times a day (BID) | ORAL | 0 refills | Status: DC

## 2020-07-27 MED ORDER — BUSPIRONE HCL 10 MG PO TABS: 20.0000 mg | ORAL_TABLET | Freq: Two times a day (BID) | ORAL | 1 refills | Status: DC

## 2020-07-27 NOTE — Patient Instructions (Addendum)
I have sent in a prescription for Augmentin to take 1 tablet 2x per day for the next 10 days.    Continue all medications as prescribed  We will plan to see you back in 6 months for anxiety follow up visit  You will receive a survey after today's visit either digitally by e-mail or paper by USPS mail. Your experiences and feedback matter to [place].  Please respond so we know how we are doing as we provide care for you.  Call us with any questions/concerns/needs.  It is my goal to be available to you for your health concerns.  Thanks for choosing me to be a partner in your healthcare needs!  Charlaine Dalton, FNP-C Family Nurse Practitioner [facility] Phone: [phone]

## 2020-07-27 NOTE — Assessment & Plan Note (Signed)
See GAD A/P

## 2020-07-27 NOTE — Assessment & Plan Note (Signed)
Reports stable and well controlled with current dosing of Wellbutrin XL, buspar and hydroxyzine.  Will continue.  RTC in 6 months for re-evaluation.

## 2020-07-27 NOTE — Assessment & Plan Note (Signed)
Right otitis media with TM rupture.  Will treat with Augmentin 875-125mg  BID x 10 days.  Advised to put cotton ball with petroleum jelly in ear when showing/washing hair to avoid water getting into ear.  To RTC PRN

## 2020-07-27 NOTE — Progress Notes (Signed)
Subjective:    Patient ID: Sydney Clark, female    DOB: [birth year], 25 y.o.   MRN: [id number]  Sydney Clark is a 25 y.o. female presenting on 07/27/2020 for Ear Drainage (Mild Rt ear discomfort that radiates down in the neck. Bloody drainage notices on Tuesday that subsided. X 3 days History of ear infections in the past with history of Eustachian tube.)   HPI  Sydney Clark presents to clinic for concerns of right ear discomfort that radiates down the back of her neck with some right ear bloody drainage x 3 days.  Reports history of chronic ear infections in the past.  Denies recent fever, illness, popping/clicking in her ears, dizziness, vertigo.  Has not taken anything for her symptoms.  Requesting medication refills for her anxiety/depression.  Reports is stable and well controlled symptoms on her current mediation regimen.  Depression screen [facility] 2/9 01/24/2020 01/05/2020 05/24/2019  Decreased Interest 0 2 1  Down, Depressed, Hopeless 1 1 1   PHQ - 2 Score 1 3 2   Altered sleeping 2 0 1  Tired, decreased energy 2 2 3   Change in appetite 0 0 2  Feeling bad or failure about yourself  1 1 3   Trouble concentrating 2 0 2  Moving slowly or fidgety/restless 0 2 0  Suicidal thoughts 0 0 0  PHQ-9 Score 8 8 13   Difficult doing work/chores Somewhat difficult Somewhat difficult Somewhat difficult    Social History   Tobacco Use  . Smoking status: Former Smoker    Packs/day: 0.50    Years: 5.00    Pack years: 2.50    Types: Cigarettes    Quit date: 04/28/2018    Years since quitting: 2.2  . Smokeless tobacco: Former  . Vaping Use: Every day  . Substances: Nicotine, Flavoring  Substance Use Topics  . Alcohol use: No  . Drug use: Yes    Types: Marijuana    Comment: last smoked this am    Review of Systems  Constitutional: Negative.   HENT: Positive for ear discharge and ear pain. Negative for congestion, dental problem, drooling, facial swelling, hearing loss, mouth  sores, nosebleeds, postnasal drip, rhinorrhea, sinus pressure, sinus pain, sneezing, sore throat, tinnitus, trouble swallowing and voice change.   Eyes: Negative.   Respiratory: Negative.   Cardiovascular: Negative.   Gastrointestinal: Negative.   Endocrine: Negative.   Genitourinary: Negative.   Musculoskeletal: Negative.   Skin: Negative.   Allergic/Immunologic: Negative.   Neurological: Negative.   Hematological: Positive for adenopathy. Does not bruise/bleed easily.  Psychiatric/Behavioral: Negative.    Per HPI unless specifically indicated above     Objective:    BP 122/67 (BP Location: Right Arm, Patient Position: Sitting, Cuff Size: Normal)   Pulse (!) 104   Temp 98 F (36.7 C) (Temporal)   Resp 18   Ht 5' 5.5" (1.664 m)   Wt 180 lb (81.6 kg)   SpO2 100%   BMI 29.50 kg/m   Wt Readings from Last 3 Encounters:  07/27/20 180 lb (81.6 kg)  01/24/20 195 lb 12.8 oz (88.8 kg)  05/24/19 196 lb (88.9 kg)    Physical Exam Vitals and nursing note reviewed.  Constitutional:      General: She is not in acute distress.    Appearance: Normal appearance. She is well-developed and well-groomed. She is not ill-appearing or toxic-appearing.  HENT:     Head: Normocephalic and atraumatic.     Right Ear: No drainage,  swelling or tenderness. Tympanic membrane is injected and perforated. Tympanic membrane is not scarred or erythematous.     Left Ear: Tympanic membrane, ear canal and external ear normal.     Nose:     Comments: Facemask is in place, covering mouth and nose. Eyes:     General: Lids are normal. Vision grossly intact.        Right eye: No discharge.        Left eye: No discharge.     Extraocular Movements: Extraocular movements intact.     Conjunctiva/sclera: Conjunctivae normal.     Pupils: Pupils are equal, round, and reactive to light.  Cardiovascular:     Pulses: Normal pulses.  Pulmonary:     Effort: Pulmonary effort is normal. No respiratory distress.   Skin:    General: Skin is warm and dry.     Capillary Refill: Capillary refill takes less than 2 seconds.  Neurological:     General: No focal deficit present.     Mental Status: She is alert and oriented to person, place, and time.  Psychiatric:        Attention and Perception: Attention and perception normal.        Mood and Affect: Mood and affect normal.        Speech: Speech normal.        Behavior: Behavior normal. Behavior is cooperative.        Thought Content: Thought content normal.        Cognition and Memory: Cognition and memory normal.        Judgment: Judgment normal.    Results for orders placed or performed in visit on 01/24/20  CBC with Differential  Result Value Ref Range   WBC 6.4 3.8 - 10.8 Thousand/uL   RBC 4.41 3.80 - 5.10 Million/uL   Hemoglobin 13.3 11.7 - 15.5 g/dL   HCT 54.2 70.6 - 23.7 %   MCV 93.2 80.0 - 100.0 fL   MCH 30.2 27.0 - 33.0 pg   MCHC 32.4 32.0 - 36.0 g/dL   RDW 62.8 31.5 - 17.6 %   Platelets 259 140 - 400 Thousand/uL   MPV 9.0 7.5 - 12.5 fL   Neutro Abs 3,437 1,500 - 7,800 cells/uL   Lymphs Abs 2,330 850 - 3,900 cells/uL   Absolute Monocytes 390 200 - 950 cells/uL   Eosinophils Absolute 211 15 - 500 cells/uL   Basophils Absolute 32 0 - 200 cells/uL   Neutrophils Relative % 53.7 %   Total Lymphocyte 36.4 %   Monocytes Relative 6.1 %   Eosinophils Relative 3.3 %   Basophils Relative 0.5 %  COMPLETE METABOLIC PANEL WITH GFR  Result Value Ref Range   Glucose, Bld 92 65 - 99 mg/dL   BUN 16 7 - 25 mg/dL   Creat 1.60 7.37 - 1.06 mg/dL   GFR, Est Non African American 87 > OR = 60 mL/min/1.100m2   GFR, Est African American 100 > OR = 60 mL/min/1.34m2   BUN/Creatinine Ratio NOT APPLICABLE 6 - 22 (calc)   Sodium 139 135 - 146 mmol/L   Potassium 4.5 3.5 - 5.3 mmol/L   Chloride 110 98 - 110 mmol/L   CO2 25 20 - 32 mmol/L   Calcium 9.7 8.6 - 10.2 mg/dL   Total Protein 7.2 6.1 - 8.1 g/dL   Albumin 4.9 3.6 - 5.1 g/dL   Globulin 2.3 1.9 -  3.7 g/dL (calc)   AG Ratio 2.1 1.0 - 2.5 (calc)  Total Bilirubin 0.6 0.2 - 1.2 mg/dL   Alkaline phosphatase (APISO) 48 31 - 125 U/L   AST 24 10 - 30 U/L   ALT 30 (H) 6 - 29 U/L  Lipid Profile  Result Value Ref Range   Cholesterol 194 <200 mg/dL   HDL 44 (L) > OR = 50 mg/dL   Triglycerides 193 (H) <150 mg/dL   LDL Cholesterol (Calc) 119 (H) mg/dL (calc)   Total CHOL/HDL Ratio 4.4 <5.0 (calc)   Non-HDL Cholesterol (Calc) 150 (H) <130 mg/dL (calc)  Thyroid Panel With TSH  Result Value Ref Range   T3 Uptake 29 22 - 35 %   T4, Total 7.7 5.1 - 11.9 mcg/dL   Free Thyroxine Index 2.2 1.4 - 3.8   TSH 0.94 mIU/L      Assessment & Plan:   Problem List Items Addressed This Visit      Nervous and Auditory   Right otitis media - Primary    Right otitis media with TM rupture.  Will treat with Augmentin 875-125mg  BID x 10 days.  Advised to put cotton ball with petroleum jelly in ear when showing/washing hair to avoid water getting into ear.  To RTC PRN      Relevant Medications   amoxicillin-clavulanate (AUGMENTIN) 875-125 MG tablet     Other   GAD (generalized anxiety disorder)    Reports stable and well controlled with current dosing of Wellbutrin XL, buspar and hydroxyzine.  Will continue.  RTC in 6 months for re-evaluation.      Relevant Medications   buPROPion (WELLBUTRIN XL) 300 MG 24 hr tablet   busPIRone (BUSPAR) 10 MG tablet   hydrOXYzine (ATARAX/VISTARIL) 25 MG tablet   Major depressive disorder, recurrent, moderate (HCC)    See GAD AP      Relevant Medications   buPROPion (WELLBUTRIN XL) 300 MG 24 hr tablet   busPIRone (BUSPAR) 10 MG tablet   hydrOXYzine (ATARAX/VISTARIL) 25 MG tablet   Anxiety   Relevant Medications   buPROPion (WELLBUTRIN XL) 300 MG 24 hr tablet   busPIRone (BUSPAR) 10 MG tablet   hydrOXYzine (ATARAX/VISTARIL) 25 MG tablet      Meds ordered this encounter  Medications  . buPROPion (WELLBUTRIN XL) 300 MG 24 hr tablet    Sig: Take 1 tablet  (300 mg total) by mouth daily.    Dispense:  90 tablet    Refill:  1  . busPIRone (BUSPAR) 10 MG tablet    Sig: Take 2 tablets (20 mg total) by mouth 2 (two) times daily.    Dispense:  360 tablet    Refill:  1  . hydrOXYzine (ATARAX/VISTARIL) 25 MG tablet    Sig: Take 1 tablet (25 mg total) by mouth 3 (three) times daily as needed.    Dispense:  90 tablet    Refill:  0  . amoxicillin-clavulanate (AUGMENTIN) 875-125 MG tablet    Sig: Take 1 tablet by mouth 2 (two) times daily.    Dispense:  20 tablet    Refill:  0   Follow up plan: Return in about 6 months (around 01/24/2021) for Anxiety F/U.   Harlin Rain, [facility] Practition[facility] 07/27/2020, 3:20 PM

## 2020-08-01 ENCOUNTER — Encounter: Payer: Self-pay | Admitting: Family Medicine

## 2020-08-02 ENCOUNTER — Encounter: Payer: Self-pay | Admitting: Family Medicine

## 2020-08-07 ENCOUNTER — Encounter: Payer: Self-pay | Admitting: Family Medicine

## 2020-08-08 ENCOUNTER — Telehealth (INDEPENDENT_AMBULATORY_CARE_PROVIDER_SITE_OTHER): Payer: Self-pay | Admitting: Family Medicine

## 2020-08-08 ENCOUNTER — Encounter: Payer: Self-pay | Admitting: Family Medicine

## 2020-08-08 ENCOUNTER — Telehealth: Payer: Self-pay

## 2020-08-08 ENCOUNTER — Other Ambulatory Visit: Payer: Self-pay

## 2020-08-08 VITALS — Temp 98.8°F

## 2020-08-08 DIAGNOSIS — H6691 Otitis media, unspecified, right ear: Secondary | ICD-10-CM

## 2020-08-08 DIAGNOSIS — M62838 Other muscle spasm: Secondary | ICD-10-CM | POA: Insufficient documentation

## 2020-08-08 MED ORDER — CYCLOBENZAPRINE HCL 5 MG PO TABS: 5.0000 mg | ORAL_TABLET | Freq: Three times a day (TID) | ORAL | 1 refills | Status: DC | PRN

## 2020-08-08 MED ORDER — DOXYCYCLINE HYCLATE 100 MG PO TABS: 100.0000 mg | ORAL_TABLET | Freq: Two times a day (BID) | ORAL | 0 refills | Status: DC

## 2020-08-08 NOTE — Assessment & Plan Note (Signed)
Treatment failure of right otitis media with augmentin.  Will treat with Doxycycline 158m BID x 10 days and referral to ENT as has history of chronic ear infections and having had met with [facility] in 2014.  Referral to ENT placed.

## 2020-08-08 NOTE — Telephone Encounter (Signed)
Copied from CRM [id number]. Topic: General - Other >> Aug 08, 2020  1:03 PM Tamela Oddi wrote: Reason for CRM: Patient would like the nurse to call her regarding her positive covid test.  She stated that she took two tests that came back positive and she has some questions.  CB# [phone]

## 2020-08-08 NOTE — Progress Notes (Signed)
Virtual Visit via Telephone  The purpose of this virtual visit is to provide medical care while limiting exposure to the novel coronavirus (COVID19) for both patient and office staff.  Consent was obtained for phone visit:  Yes.   Answered questions that patient had about telehealth interaction:  Yes.   I discussed the limitations, risks, security and privacy concerns of performing an evaluation and management service by telephone. I also discussed with the patient that there may be a patient responsible charge related to this service. The patient expressed understanding and agreed to proceed.  Patient is at home and is accessed via telephone Services are provided by Harlin Rain, FNP-C from [facility])  ---------------------------------------------------------------------- Chief Complaint  Patient presents with  . Ear Pain    Rt ear pain w/ possible swollen lymph node under the Rt ear x 12 days , fever ranging 99.9- 100.5 on yesterday,intermittent headache, and mid- lower back pain x3 days     S: Reviewed CMA documentation. I have called patient and gathered additional HPI as follows:  Casimer Bilis presents for virtual telemedicine visit via telephone with concerns for continued right ear pain with right sided swollen lymph node under the right ear x 12 days.  Has intermittent headache.  Has completed the 10 days of Augmentin without improvement in symptoms.  History of chronic ear concerns, having tubes in ear in the past.  Has met with [facility] in 2014 for similar concerns.  Has concerns for lower back pain x 3 days.  Reports was in the shower, had gotten out, had some coffee, and had a panic sensation that caused tightening of her lower back muscles, which she has been unable to relax since.  Has been using topical biofreeze with approximately 30 minutes of relief.  Denies any dysuria, urinary frequency, urgency, hesitancy, feeling of incomplete emptying, or  hematuria.  Patient is currently home Denies any high risk travel to areas of current concern for COVID19. Denies any known or suspected exposure to person with or possibly with COVID19.  Past Medical History:  Diagnosis Date  . Anxiety   . Depression    Social History   Tobacco Use  . Smoking status: Former Smoker    Packs/day: 0.50    Years: 5.00    Pack years: 2.50    Types: Cigarettes    Quit date: 04/28/2018    Years since quitting: 2.2  . Smokeless tobacco: Former Network engineer  . Vaping Use: Every day  . Substances: Nicotine, Flavoring  Substance Use Topics  . Alcohol use: No  . Drug use: Yes    Types: Marijuana    Comment: last smoked this am    Current Outpatient Medications:  .  albuterol (VENTOLIN HFA) 108 (90 Base) MCG/ACT inhaler, TAKE 2 PUFFS BY MOUTH INTO THE LUNGS EVERY 6 HOURS AS NEEDED, Disp: 8.5 each, Rfl: 0 .  Ascorbic Acid (VITAMIN C) 100 MG tablet, Take 100 mg by mouth daily., Disp: , Rfl:  .  buPROPion (WELLBUTRIN XL) 300 MG 24 hr tablet, Take 1 tablet (300 mg total) by mouth daily., Disp: 90 tablet, Rfl: 1 .  busPIRone (BUSPAR) 10 MG tablet, Take 2 tablets (20 mg total) by mouth 2 (two) times daily., Disp: 360 tablet, Rfl: 1 .  cyclobenzaprine (FLEXERIL) 5 MG tablet, Take 1 tablet (5 mg total) by mouth 3 (three) times daily as needed for muscle spasms., Disp: 30 tablet, Rfl: 1 .  dimenhyDRINATE (DRAMAMINE) 50 MG  tablet, Take 50 mg by mouth daily as needed., Disp: , Rfl:  .  doxycycline (VIBRA-TABS) 100 MG tablet, Take 1 tablet (100 mg total) by mouth 2 (two) times daily., Disp: 20 tablet, Rfl: 0 .  hydrOXYzine (ATARAX/VISTARIL) 25 MG tablet, Take 1 tablet (25 mg total) by mouth 3 (three) times daily as needed., Disp: 90 tablet, Rfl: 0 .  Multiple Vitamin (MULTIVITAMIN) tablet, Take 1 tablet by mouth daily., Disp: , Rfl:  .  omeprazole (PRILOSEC) 20 MG capsule, Take 1 capsule (20 mg total) by mouth daily., Disp: 90 capsule, Rfl: 1 .  zinc gluconate  50 MG tablet, Take 50 mg by mouth daily., Disp: , Rfl:  .  cholecalciferol (VITAMIN D3) 25 MCG (1000 UT) tablet, Take 1,000 Units by mouth daily. (Patient not taking: No sig reported), Disp: , Rfl:   Depression screen [facility] 2/9 01/24/2020 01/05/2020 05/24/2019  Decreased Interest 0 2 1  Down, Depressed, Hopeless _0 PHQ - 2 Score _1 Altered sleeping 2 0 1  Tired, decreased energy _2 Change in appetite 0 0 2  Feeling bad or failure about yourself  _3 Trouble concentrating 2 0 2  Moving slowly or fidgety/restless 0 2 0  Suicidal thoughts 0 0 0  PHQ-9 Score _4 Difficult doing work/chores Somewhat difficult Somewhat difficult Somewhat difficult    GAD 7 : Generalized Anxiety Score 01/24/2020 01/05/2020 05/24/2019 02/21/2019  Nervous, Anxious, on Edge _5 Control/stop worrying _6 Worry too much - different things 0 _7 Trouble relaxing _8 Restless 0 0 0 0  Easily annoyed or irritable _9 Afraid - awful might happen 0 0 0 0  Total GAD 7 Score _10 Anxiety Difficulty Somewhat difficult Somewhat difficult Somewhat difficult Somewhat difficult    -------------------------------------------------------------------------- O: No physical exam performed due to remote telephone encounter.  Physical Exam: Patient remotely monitored without video.  Verbal communication appropriate.  Cognition normal.  No results found for this or any previous visit (from the past 2160 hour(s)).  -------------------------------------------------------------------------- A&P:  Problem List Items Addressed This Visit      Nervous and Auditory   Right otitis media - Primary    Treatment failure of right otitis media with augmentin.  Will treat with Doxycycline 11m BID x 10 days and referral to ENT as has history of chronic ear infections and having had met with [facility] in 2014.  Referral to ENT placed.      Relevant Medications   doxycycline (VIBRA-TABS) 100  MG tablet   Other Relevant Orders   Ambulatory referral to ENT     Other   Muscle spasm    Likely muscle strain/spasm secondary to tension with recent panic attack.  Will treat with cyclobenzaprine 531mTID PRN for muscle relaxation.  Can continue biofreeze topically and PRN ibuprofen, according to packaging directions.      Relevant Medications   cyclobenzaprine (FLEXERIL) 5 MG tablet      Meds ordered this encounter  Medications  . doxycycline (VIBRA-TABS) 100 MG tablet    Sig: Take 1 tablet (100 mg total) by mouth 2 (two) times daily.    Dispense:  20 tablet    Refill:  0  . cyclobenzaprine (FLEXERIL) 5 MG tablet    Sig: Take 1 tablet (5 mg total) by mouth 3 (three)  times daily as needed for muscle spasms.    Dispense:  30 tablet    Refill:  1    Follow-up: - Return if symptoms worsen or fail to improve  Patient verbalizes understanding with the above medical recommendations including the limitation of remote medical advice.  Specific follow-up and call-back criteria were given for patient to follow-up or seek medical care more urgently if needed.  - Time spent in direct consultation with patient on phone: 6 minutes  Harlin Rain, [facility] 08/08/2020, 8:56 AM

## 2020-08-08 NOTE — Assessment & Plan Note (Signed)
Likely muscle strain/spasm secondary to tension with recent panic attack.  Will treat with cyclobenzaprine 5mg  TID PRN for muscle relaxation.  Can continue biofreeze topically and PRN ibuprofen, according to packaging directions.

## 2020-08-08 NOTE — Telephone Encounter (Signed)
See previous message

## 2020-08-14 ENCOUNTER — Ambulatory Visit
Admission: EM | Admit: 2020-08-14 | Discharge: 2020-08-14 | Disposition: A | Discharge status: Home / Self Care | Payer: Self-pay | Attending: Family Medicine | Admitting: Family Medicine

## 2020-08-14 DIAGNOSIS — R112 Nausea with vomiting, unspecified: Secondary | ICD-10-CM

## 2020-08-14 MED ORDER — ONDANSETRON 4 MG PO TBDP: 4.0000 mg | ORAL_TABLET | Freq: Three times a day (TID) | ORAL | 0 refills | Status: DC | PRN

## 2020-08-14 NOTE — Discharge Instructions (Signed)
Zofran as needed for nausea, vomiting. Try to stay hydrated Follow up as needed for continued or worsening symptoms

## 2020-08-14 NOTE — ED Provider Notes (Signed)
Sydney Clark    CSN: [id number] Arrival date & time: 08/14/20  1038      History   Chief Complaint Chief Complaint  Patient presents with  . Nausea  . Diarrhea    HPI Sydney Clark is a 25 y.o. female.   Patient is a 25 year old female who presents today with complaints of persistent nausea, upset stomach and mild diarrhea this is been present for the past week.  Tested positive for Covid last Wednesday.  Most of her symptoms from Covid have resolved except for this persistent nausea.  Was taking antibiotics but stopped this due to the upset stomach.  Has been using over-the-counter Pepto without any relief.  Denies any abdominal pain, back pain, fevers.  No cough, chest congestion, chest pain, shortness of breath     Past Medical History:  Diagnosis Date  . Anxiety   . Depression     Patient Active Problem List   Diagnosis Date Noted  . Muscle spasm 08/08/2020  . Right otitis media 07/27/2020  . Anxiety 07/27/2020  . Routine medical exam 01/24/2020  . GAD (generalized anxiety disorder) 11/12/2018  . Major depressive disorder, recurrent, moderate (HCC) 11/12/2018    Past Surgical History:  Procedure Laterality Date  . ADENOIDECTOMY    . MYRINGOTOMY WITH TUBE PLACEMENT    . TONSILLECTOMY      OB History   No obstetric history on file.      Home Medications    Prior to Admission medications   Medication Sig Start Date End Date Taking? Authorizing Provider  albuterol (VENTOLIN HFA) 108 (90 Base) MCG/ACT inhaler TAKE 2 PUFFS BY MOUTH INTO THE LUNGS EVERY 6 HOURS AS NEEDED 07/07/20   Malfi, Jodelle Gross, FNP  Ascorbic Acid (VITAMIN C) 100 MG tablet Take 100 mg by mouth daily.    [provider]  buPROPion (WELLBUTRIN XL) 300 MG 24 hr tablet Take 1 tablet (300 mg total) by mouth daily. 07/27/20   Malfi, Jodelle Gross, FNP  busPIRone (BUSPAR) 10 MG tablet Take 2 tablets (20 mg total) by mouth 2 (two) times daily. 07/27/20   Malfi, Jodelle Gross, FNP   cholecalciferol (VITAMIN D3) 25 MCG (1000 UT) tablet Take 1,000 Units by mouth daily. Patient not taking: No sig reported    [provider]  cyclobenzaprine (FLEXERIL) 5 MG tablet Take 1 tablet (5 mg total) by mouth 3 (three) times daily as needed for muscle spasms. 08/08/20   Malfi, Jodelle Gross, FNP  dimenhyDRINATE (DRAMAMINE) 50 MG tablet Take 50 mg by mouth daily as needed.    [provider]  doxycycline (VIBRA-TABS) 100 MG tablet Take 1 tablet (100 mg total) by mouth 2 (two) times daily. 08/08/20   Malfi, Jodelle Gross, FNP  hydrOXYzine (ATARAX/VISTARIL) 25 MG tablet Take 1 tablet (25 mg total) by mouth 3 (three) times daily as needed. 07/27/20   Malfi, Jodelle Gross, FNP  Multiple Vitamin (MULTIVITAMIN) tablet Take 1 tablet by mouth daily.    [provider]  omeprazole (PRILOSEC) 20 MG capsule Take 1 capsule (20 mg total) by mouth daily. 09/20/18   Galen Manila, NP  ondansetron (ZOFRAN ODT) 4 MG disintegrating tablet Take 1 tablet (4 mg total) by mouth every 8 (eight) hours as needed for nausea or vomiting. 08/14/20   Malfi, Jodelle Gross, FNP  zinc gluconate 50 MG tablet Take 50 mg by mouth daily.    [provider]    Family History Family History  Problem Relation Age of  Onset  . Cirrhosis Mother   . Alcohol abuse Mother   . Depression Mother   . Alcohol abuse Father   . Anxiety disorder Sister   . Cancer Neg Hx   . Heart attack Neg Hx   . Stroke Neg Hx     Social History Social History   Tobacco Use  . Smoking status: Former Smoker    Packs/day: 0.50    Years: 5.00    Pack years: 2.50    Types: Cigarettes    Quit date: 04/28/2018    Years since quitting: 2.2  . Smokeless tobacco: Former Clinical biochemist  . Vaping Use: Every day  . Substances: Nicotine, Flavoring  Substance Use Topics  . Alcohol use: No  . Drug use: Yes    Types: Marijuana    Comment: last smoked this am     Allergies   Patient has no known allergies.   Review of  Systems Review of Systems   Physical Exam Triage Vital Signs ED Triage Vitals  Enc Vitals Group     BP 08/14/20 1052 117/79     Pulse Rate 08/14/20 1052 98     Resp 08/14/20 1052 16     Temp 08/14/20 1052 98.5 F (36.9 C)     Temp Source 08/14/20 1052 Oral     SpO2 08/14/20 1052 97 %     Weight 08/14/20 1057 180 lb (81.6 kg)     Height --      Head Circumference --      Peak Flow --      Pain Score 08/14/20 1057 0     Pain Loc --      Pain Edu? --      Excl. in GC? --    No data found.  Updated Vital Signs BP 117/79 (BP Location: Left Arm)   Pulse 98   Temp 98.5 F (36.9 C) (Oral)   Resp 16   Wt 180 lb (81.6 kg)   LMP 08/08/2020   SpO2 97%   BMI 29.50 kg/m   Visual Acuity Right Eye Distance:   Left Eye Distance:   Bilateral Distance:    Right Eye Near:   Left Eye Near:    Bilateral Near:     Physical Exam Vitals and nursing note reviewed.  Constitutional:      General: She is not in acute distress.    Appearance: Normal appearance. She is not ill-appearing, toxic-appearing or diaphoretic.  HENT:     Head: Normocephalic.  Eyes:     Conjunctiva/sclera: Conjunctivae normal.  Pulmonary:     Effort: Pulmonary effort is normal.  Musculoskeletal:        General: Normal range of motion.     Cervical back: Normal range of motion.  Skin:    General: Skin is warm and dry.     Findings: No rash.  Neurological:     Mental Status: She is alert.  Psychiatric:        Mood and Affect: Mood normal.      UC Treatments / Results  Labs (all labs ordered are listed, but only abnormal results are displayed) Labs Reviewed - No data to display  EKG   Radiology No results found.  Procedures Procedures (including critical care time)  Medications Ordered in UC Medications - No data to display  Initial Impression / Assessment and Plan / UC Course  I have reviewed the triage vital signs and the nursing notes.  Pertinent labs &  imaging results that were  available during my care of the patient were reviewed by me and considered in my medical decision making (see chart for details).     Nausea related to COVID-19 Zofran prescribed for nausea, vomiting as needed Recommended drink fluids to stay hydrated Follow up as needed for continued or worsening symptoms  Final Clinical Impressions(s) / UC Diagnoses   Final diagnoses:  Nausea and vomiting, intractability of vomiting not specified, unspecified vomiting type     Discharge Instructions     Zofran as needed for nausea, vomiting. Try to stay hydrated Follow up as needed for continued or worsening symptoms     ED Prescriptions    Medication Sig Dispense Auth. Provider   ondansetron (ZOFRAN ODT) 4 MG disintegrating tablet Take 1 tablet (4 mg total) by mouth every 8 (eight) hours as needed for nausea or vomiting. 20 tablet Dahlia Byes A, NP     PDMP not reviewed this encounter.   Janace Aris, NP 08/14/20 1451

## 2020-08-14 NOTE — ED Triage Notes (Signed)
Patient presents to Urgent Care with complaints of nausea and diarrhea x 1 week. Pt reports on-going nausea, diarrhea 2-3 episodes within the last week. Tested positive for covid last weds.  Treating symptoms with  otc pepto. She stopped the antibx course due to the nausea.   Denies fever, abdominal pain.

## 2020-09-04 ENCOUNTER — Other Ambulatory Visit: Payer: Self-pay

## 2020-09-04 ENCOUNTER — Encounter: Payer: Self-pay | Admitting: Unknown Physician Specialty

## 2020-09-04 ENCOUNTER — Ambulatory Visit (INDEPENDENT_AMBULATORY_CARE_PROVIDER_SITE_OTHER): Payer: Self-pay | Admitting: Unknown Physician Specialty

## 2020-09-04 VITALS — BP 127/63 | HR 124 | Temp 98.7°F | Resp 18 | Ht 65.5 in | Wt 177.4 lb

## 2020-09-04 DIAGNOSIS — F419 Anxiety disorder, unspecified: Secondary | ICD-10-CM

## 2020-09-04 MED ORDER — BUSPIRONE HCL 30 MG PO TABS: 30.0000 mg | ORAL_TABLET | Freq: Two times a day (BID) | ORAL | 1 refills | Status: DC

## 2020-09-04 NOTE — Assessment & Plan Note (Addendum)
Increased Buspar to 30 mg BID. Discussed use of [employer]ks[employer] and exercise. Regulation of circadian rhythm by walking twice a day and adding Melatonin 10 mg 1 hr before bed.

## 2020-09-04 NOTE — Patient Instructions (Signed)
2 10 minute walks Walk in the morning as possible and again at sunsetl  Melatonin 10 g 1 hour before bed  Vit D3 400 IUs daily  NSDR - program  [employer]

## 2020-09-04 NOTE — Progress Notes (Signed)
BP 127/63 (BP Location: Right Arm, Patient Position: Sitting, Cuff Size: Normal)   Pulse (!) 124   Temp 98.7 F (37.1 C) (Temporal)   Resp 18   Ht 5' 5.5" (1.664 m)   Wt 177 lb 6.4 oz (80.5 kg)   LMP 08/08/2020   SpO2 99%   BMI 29.07 kg/m    Subjective:    Patient ID: Sydney Clark, female    DOB: 06/10/1996, 25 y.o.   MRN: [id number]  HPI: Sydney Clark is a 25 y.o. female   Chief Complaint  Patient presents with  . Anxiety    Pt would like to make some adjustment to her anxiety medications  . Ear Pain    Intermittent dull stabbing Rt ear pain. Pt also notice a little of blood on the pillow. X 1 mth   Anxiety Patient states she has had increased anxiety and loss of appetite since January. Patient states she has lost loved ones during that time due to COVID and had the illness herself. States that Wellbutrin has been the most help for her anxiety but is no longer working as it was. Has tried multiple anxiety medications in the past that were of no help or resulted in side effects. Patient denies chest pain, palpitations, and shortness of breath.   Ear pain Patient states she has had intermittent pain in right ear with bloody discharge for 1 month. Hx of chronic ear infections.    Relevant past medical, surgical, family and social history reviewed and updated as indicated. Interim medical history since our last visit reviewed. Allergies and medications reviewed and updated.  Review of Systems  Constitutional: Positive for appetite change and fatigue.  HENT: Positive for ear discharge and ear pain. Negative for postnasal drip.   Respiratory: Negative for shortness of breath.   Cardiovascular: Negative for chest pain and palpitations.  Gastrointestinal: Negative for nausea.  Neurological: Negative for headaches.  Psychiatric/Behavioral: Positive for sleep disturbance. Negative for suicidal ideas. The patient is nervous/anxious.     Per HPI unless specifically indicated  above  GAD 7 : Generalized Anxiety Score 09/04/2020 01/24/2020 01/05/2020 05/24/2019  Nervous, Anxious, on Edge 3 3 2 3   Control/stop worrying 3 2 2 2   Worry too much - different things 2 0 1 2  Trouble relaxing 1 1 2 1   Restless 1 0 0 0  Easily annoyed or irritable 3 3 3 3   Afraid - awful might happen 1 0 0 0  Total GAD 7 Score 14 9 10 11   Anxiety Difficulty Very difficult Somewhat difficult Somewhat difficult Somewhat difficult   Flowsheet Row Office Visit from 09/04/2020 in [facility]  PHQ-9 Total Score 18          Objective:    BP 127/63 (BP Location: Right Arm, Patient Position: Sitting, Cuff Size: Normal)   Pulse (!) 124   Temp 98.7 F (37.1 C) (Temporal)   Resp 18   Ht 5' 5.5" (1.664 m)   Wt 177 lb 6.4 oz (80.5 kg)   LMP 08/08/2020   SpO2 99%   BMI 29.07 kg/m   Wt Readings from Last 3 Encounters:  09/04/20 177 lb 6.4 oz (80.5 kg)  08/14/20 180 lb (81.6 kg)  07/27/20 180 lb (81.6 kg)     Physical Exam Vitals reviewed.  HENT:     Head: Normocephalic and atraumatic.     Right Ear: Tympanic membrane, ear canal and external ear normal.     Left  Ear: Tympanic membrane, ear canal and external ear normal.  Cardiovascular:     Rate and Rhythm: Tachycardia present.     Heart sounds: Normal heart sounds.  Pulmonary:     Breath sounds: Normal breath sounds.  Skin:    General: Skin is warm and dry.  Neurological:     General: No focal deficit present.     Mental Status: She is alert and oriented to person, place, and time.  Psychiatric:        Mood and Affect: Mood is anxious.        Speech: Speech normal.        Behavior: Behavior normal.        Thought Content: Thought content normal.     Results for orders placed or performed in visit on 01/24/20  CBC with Differential  Result Value Ref Range   WBC 6.4 3.8 - 10.8 Thousand/uL   RBC 4.41 3.80 - 5.10 Million/uL   Hemoglobin 13.3 11.7 - 15.5 g/dL   HCT 16.1 09.6 - 04.5 %   MCV 93.2 80.0 - 100.0  fL   MCH 30.2 27.0 - 33.0 pg   MCHC 32.4 32.0 - 36.0 g/dL   RDW 40.9 81.1 - 91.4 %   Platelets 259 140 - 400 Thousand/uL   MPV 9.0 7.5 - 12.5 fL   Neutro Abs 3,437 1,500 - 7,800 cells/uL   Lymphs Abs 2,330 850 - 3,900 cells/uL   Absolute Monocytes 390 200 - 950 cells/uL   Eosinophils Absolute 211 15 - 500 cells/uL   Basophils Absolute 32 0 - 200 cells/uL   Neutrophils Relative % 53.7 %   Total Lymphocyte 36.4 %   Monocytes Relative 6.1 %   Eosinophils Relative 3.3 %   Basophils Relative 0.5 %  COMPLETE METABOLIC PANEL WITH GFR  Result Value Ref Range   Glucose, Bld 92 65 - 99 mg/dL   BUN 16 7 - 25 mg/dL   Creat 7.82 9.56 - 2.13 mg/dL   GFR, Est Non African American 87 > OR = 60 mL/min/1.17m2   GFR, Est African American 100 > OR = 60 mL/min/1.58m2   BUN/Creatinine Ratio NOT APPLICABLE 6 - 22 (calc)   Sodium 139 135 - 146 mmol/L   Potassium 4.5 3.5 - 5.3 mmol/L   Chloride 110 98 - 110 mmol/L   CO2 25 20 - 32 mmol/L   Calcium 9.7 8.6 - 10.2 mg/dL   Total Protein 7.2 6.1 - 8.1 g/dL   Albumin 4.9 3.6 - 5.1 g/dL   Globulin 2.3 1.9 - 3.7 g/dL (calc)   AG Ratio 2.1 1.0 - 2.5 (calc)   Total Bilirubin 0.6 0.2 - 1.2 mg/dL   Alkaline phosphatase (APISO) 48 31 - 125 U/L   AST 24 10 - 30 U/L   ALT 30 (H) 6 - 29 U/L  Lipid Profile  Result Value Ref Range   Cholesterol 194 <200 mg/dL   HDL 44 (L) > OR = 50 mg/dL   Triglycerides 086 (H) <150 mg/dL   LDL Cholesterol (Calc) 119 (H) mg/dL (calc)   Total CHOL/HDL Ratio 4.4 <5.0 (calc)   Non-HDL Cholesterol (Calc) 150 (H) <130 mg/dL (calc)  Thyroid Panel With TSH  Result Value Ref Range   T3 Uptake 29 22 - 35 %   T4, Total 7.7 5.1 - 11.9 mcg/dL   Free Thyroxine Index 2.2 1.4 - 3.8   TSH 0.94 mIU/L      Assessment & Plan:     Problem  List Items Addressed This Visit      Other   Anxiety - Primary    Increased Buspar to 30 mg BID. Discussed use of [employer] and exercise. Regulation of circadian rhythm by walking twice a day and  adding Melatonin 10 mg 1 hr before bed.       Relevant Medications   busPIRone (BUSPAR) 30 MG tablet       Follow up plan: Return in about 4 weeks (around 10/02/2020).

## 2020-09-10 ENCOUNTER — Encounter: Payer: Self-pay | Admitting: Unknown Physician Specialty

## 2020-09-18 ENCOUNTER — Other Ambulatory Visit: Payer: Self-pay | Admitting: Unknown Physician Specialty

## 2020-09-18 DIAGNOSIS — R110 Nausea: Secondary | ICD-10-CM

## 2020-09-18 MED ORDER — PROPRANOLOL HCL ER 60 MG PO CP24: 60.0000 mg | ORAL_CAPSULE | Freq: Every day | ORAL | 0 refills | Status: DC

## 2020-09-18 MED ORDER — ONDANSETRON 4 MG PO TBDP: 4.0000 mg | ORAL_TABLET | Freq: Once | ORAL | Status: DC

## 2020-09-25 ENCOUNTER — Encounter: Payer: Self-pay | Admitting: Unknown Physician Specialty

## 2020-10-12 ENCOUNTER — Other Ambulatory Visit: Payer: Self-pay | Admitting: Unknown Physician Specialty

## 2020-10-12 ENCOUNTER — Encounter: Payer: Self-pay | Admitting: Unknown Physician Specialty

## 2020-10-12 MED ORDER — LORAZEPAM 0.5 MG PO TABS: 0.5000 mg | ORAL_TABLET | Freq: Every day | ORAL | 0 refills | Status: DC | PRN

## 2020-10-30 ENCOUNTER — Encounter: Payer: Self-pay | Admitting: Unknown Physician Specialty

## 2020-10-30 ENCOUNTER — Other Ambulatory Visit: Payer: Self-pay

## 2020-10-30 DIAGNOSIS — F419 Anxiety disorder, unspecified: Secondary | ICD-10-CM

## 2020-10-30 MED ORDER — HYDROXYZINE HCL 25 MG PO TABS: 25.0000 mg | ORAL_TABLET | Freq: Three times a day (TID) | ORAL | 0 refills | Status: DC | PRN

## 2020-11-19 ENCOUNTER — Other Ambulatory Visit: Payer: Self-pay

## 2020-11-19 ENCOUNTER — Ambulatory Visit: Payer: Self-pay | Admitting: Internal Medicine

## 2020-11-19 ENCOUNTER — Encounter: Payer: Self-pay | Admitting: Internal Medicine

## 2020-11-19 DIAGNOSIS — F32A Depression, unspecified: Secondary | ICD-10-CM

## 2020-11-19 DIAGNOSIS — F419 Anxiety disorder, unspecified: Secondary | ICD-10-CM

## 2020-11-19 MED ORDER — VENLAFAXINE HCL ER 75 MG PO CP24: ORAL_CAPSULE | ORAL | 0 refills | Status: DC

## 2020-11-19 MED ORDER — HYDROXYZINE PAMOATE 50 MG PO CAPS: 50.0000 mg | ORAL_CAPSULE | Freq: Two times a day (BID) | ORAL | 0 refills | Status: DC | PRN

## 2020-11-19 NOTE — Progress Notes (Signed)
Subjective:    Patient ID: Sydney Clark, female    DOB: [birth year], 25 y.o.   MRN: [id number]  HPI  Pt presents to the clinic today for follow up of anxiety and depression. She is establishing care with me today, transferring care from Danielle Rankin, NP.  Anxiety and Depression: Chronic since 2012-11-22 after the death of her grandmother. Managed on Wellbutrin, Buspirone, Hydroxyzine (1 x day), Lorazepam (only given to her after the loss of her dog, but she does not feel like it is effective). She does not feel like her current regimen is working for her. She failed Citalopram, Fluoxetine, Paroxetine, and Lamictal in the past but had side effects with all of these meds. She reports she is having more issues with anxiety and depression, which she thinks is all situational. She is not currently seeing a therapist or psychiatrist because she did not feel like it was helping. She does smoke weed occasionally which does help with her symptoms. She denies SI/HI. There is no CSA or UDS on file.  Review of Systems      Past Medical History:  Diagnosis Date  . Anxiety   . Depression     Current Outpatient Medications  Medication Sig Dispense Refill  . albuterol (VENTOLIN HFA) 108 (90 Base) MCG/ACT inhaler TAKE 2 PUFFS BY MOUTH INTO THE LUNGS EVERY 6 HOURS AS NEEDED 8.5 each 0  . Ascorbic Acid (VITAMIN C) 100 MG tablet Take 100 mg by mouth daily.    Marland Kitchen buPROPion (WELLBUTRIN XL) 300 MG 24 hr tablet Take 1 tablet (300 mg total) by mouth daily. 90 tablet 1  . busPIRone (BUSPAR) 30 MG tablet Take 1 tablet (30 mg total) by mouth 2 (two) times daily. (Patient taking differently: Take 20 mg by mouth 3 (three) times daily.) 60 tablet 1  . cholecalciferol (VITAMIN D3) 25 MCG (1000 UT) tablet Take 1,000 Units by mouth daily.    Marland Kitchen dimenhyDRINATE (DRAMAMINE) 50 MG tablet Take 50 mg by mouth daily as needed.    . hydrOXYzine (ATARAX/VISTARIL) 25 MG tablet Take 1 tablet (25 mg total) by mouth 3 (three) times daily  as needed. 90 tablet 0  . Multiple Vitamin (MULTIVITAMIN) tablet Take 1 tablet by mouth daily.    Marland Kitchen omeprazole (PRILOSEC) 20 MG capsule Take 1 capsule (20 mg total) by mouth daily. 90 capsule 1  . doxycycline (VIBRA-TABS) 100 MG tablet Take 1 tablet (100 mg total) by mouth 2 (two) times daily. (Patient not taking: No sig reported) 20 tablet 0  . LORazepam (ATIVAN) 0.5 MG tablet Take 1 tablet (0.5 mg total) by mouth daily as needed for anxiety. 10 tablet 0  . propranolol ER (INDERAL LA) 60 MG 24 hr capsule Take 1 capsule (60 mg total) by mouth at bedtime. (Patient not taking: Reported on 11/19/2020) 30 capsule 0   Current Facility-Administered Medications  Medication Dose Route Frequency Provider Last Rate Last Admin  . ondansetron (ZOFRAN-ODT) disintegrating tablet 4 mg  4 mg Oral Once Gabriel Cirri, NP        No Known Allergies  Family History  Problem Relation Age of Onset  . Cirrhosis Mother   . Alcohol abuse Mother   . Depression Mother   . Alcohol abuse Father   . Anxiety disorder Sister   . Cancer Neg Hx   . Heart attack Neg Hx   . Stroke Neg Hx     Social History   Socioeconomic History  . Marital status: Media planner  Spouse name: Not on file  . Number of children: 0  . Years of education: Not on file  . Highest education level: 10th grade  Occupational History  . Not on file  Tobacco Use  . Smoking status: Former Smoker    Packs/day: 0.50    Years: 5.00    Pack years: 2.50    Types: Cigarettes    Quit date: 04/28/2018    Years since quitting: 2.5  . Smokeless tobacco: Former Clinical biochemist  . Vaping Use: Every day  . Substances: Nicotine, Flavoring  Substance and Sexual Activity  . Alcohol use: No  . Drug use: Yes    Types: Marijuana    Comment: last smoked this am  . Sexual activity: Yes    Birth control/protection: None  Other Topics Concern  . Not on file  Social History Narrative  . Not on file   Social Determinants of Health    Financial Resource Strain: Not on file  Food Insecurity: Not on file  Transportation Needs: Not on file  Physical Activity: Not on file  Stress: Not on file  Social Connections: Not on file  Intimate Partner Violence: Not on file     Constitutional: Denies fever, malaise, fatigue, headache or abrupt weight changes.  Respiratory: Denies difficulty breathing, shortness of breath, cough or sputum production.   Cardiovascular: Denies chest pain, chest tightness, palpitations or swelling in the hands or feet.  Neurological: Denies dizziness, difficulty with memory, difficulty with speech or problems with balance and coordination.  Psych: Pt has a history of anxiety and depression. Denies SI/HI.  No other specific complaints in a complete review of systems (except as listed in HPI above).  Objective:   Physical Exam   BP 116/71 (BP Location: Left Arm, Patient Position: Sitting, Cuff Size: Normal)   Pulse (!) 116   Temp 98.4 F (36.9 C) (Oral)   Wt 176 lb (79.8 kg)   SpO2 100%   BMI 28.84 kg/m  Wt Readings from Last 3 Encounters:  11/19/20 176 lb (79.8 kg)  09/04/20 177 lb 6.4 oz (80.5 kg)  08/14/20 180 lb (81.6 kg)    General: Appears herstated age, well developed, well nourished in NAD. HEENT: Head: normal shape and size; Eyes: sclera whiteand EOMs intact;  Cardiovascular: NTachcyardic. Pulmonary/Chest: Normal effort. Neurological: Alert and oriented.  Psychiatric: Mildly anxious appearing.  BMET    Component Value Date/Time   NA 139 01/24/2020 1055   NA 140 07/06/2014 1143   K 4.5 01/24/2020 1055   K 3.9 07/06/2014 1143   CL 110 01/24/2020 1055   CL 107 07/06/2014 1143   CO2 25 01/24/2020 1055   CO2 23 07/06/2014 1143   GLUCOSE 92 01/24/2020 1055   GLUCOSE 102 (H) 07/06/2014 1143   BUN 16 01/24/2020 1055   BUN 9 07/06/2014 1143   CREATININE 0.92 01/24/2020 1055   CALCIUM 9.7 01/24/2020 1055   CALCIUM 9.5 07/06/2014 1143   GFRNONAA 87 01/24/2020 1055    GFRAA 100 01/24/2020 1055    Lipid Panel     Component Value Date/Time   CHOL 194 01/24/2020 1055   TRIG 193 (H) 01/24/2020 1055   HDL 44 (L) 01/24/2020 1055   CHOLHDL 4.4 01/24/2020 1055   LDLCALC 119 (H) 01/24/2020 1055    CBC    Component Value Date/Time   WBC 6.4 01/24/2020 1055   RBC 4.41 01/24/2020 1055   HGB 13.3 01/24/2020 1055   HGB 15.3 07/06/2014 1143  HCT 41.1 01/24/2020 1055   HCT 45.0 07/06/2014 1143   PLT 259 01/24/2020 1055   PLT 236 07/06/2014 1143   MCV 93.2 01/24/2020 1055   MCV 96 07/06/2014 1143   MCH 30.2 01/24/2020 1055   MCHC 32.4 01/24/2020 1055   RDW 12.7 01/24/2020 1055   RDW 12.4 07/06/2014 1143   LYMPHSABS 2,330 01/24/2020 1055   EOSABS 211 01/24/2020 1055   BASOSABS 32 01/24/2020 1055    Hgb A1C No results found for: HGBA1C        Assessment & Plan:    Paschal Dopp, CMA This visit occurred during the SARS-CoV-2 public health emergency.  Safety protocols were in place, including screening questions prior to the visit, additional usage of staff PPE, and extensive cleaning of exam room while observing appropriate contact time as indicated for disinfecting solutions.

## 2020-11-19 NOTE — Patient Instructions (Addendum)
Start the Venlafaxine whenever you are ready. After 10 days, start taking your Wellbutrin every other day x 1 week, then every 2 days x 1 week, then every 3 days x 1 week, then stop Wellbutrin altogether.  Stop the Buspar and start taking Hydroxyzine 50 mg 2 x day for at least a week, then you can try to take it as needed thereafter. We will not refill the Lorazepam as it has not been effective for you.  Update me in 1 month and let me know how you are doing.

## 2020-11-19 NOTE — Assessment & Plan Note (Signed)
Persistent Support offered today Will plan to transition off Wellbutrin and try Venlafaxine- weaning instructions provided Will stop Buspirone and continue Hydroxyzine but will increase dose to 50 mg BID prn Will d/c Lorazepam due to ineffectiveness She is not interested in referral for therapy or psychiatry at this time  Update me in 1 month via mychart and let me know how you are doing, RTC in 3 months for your annual exam

## 2020-11-20 ENCOUNTER — Encounter: Payer: Self-pay | Admitting: Internal Medicine

## 2020-11-21 MED ORDER — ALPRAZOLAM 0.5 MG PO TABS: ORAL_TABLET | ORAL | 0 refills | Status: DC

## 2020-11-21 NOTE — Addendum Note (Signed)
Addended by: Lorre Munroe on: 11/21/2020 07:30 PM   Modules accepted: Orders

## 2020-12-17 ENCOUNTER — Encounter: Payer: Self-pay | Admitting: Internal Medicine

## 2021-01-07 ENCOUNTER — Encounter: Payer: Self-pay | Admitting: Internal Medicine

## 2021-01-10 MED ORDER — BUSPIRONE HCL 30 MG PO TABS: 30.0000 mg | ORAL_TABLET | Freq: Two times a day (BID) | ORAL | 2 refills | Status: DC

## 2021-01-10 NOTE — Addendum Note (Signed)
Addended by: Lorre Munroe on: 01/10/2021 02:20 PM   Modules accepted: Orders

## 2021-02-02 ENCOUNTER — Encounter: Payer: Self-pay | Admitting: Internal Medicine

## 2021-02-04 MED ORDER — BUPROPION HCL ER (XL) 150 MG PO TB24: 150.0000 mg | ORAL_TABLET | Freq: Two times a day (BID) | ORAL | 2 refills | Status: DC

## 2021-02-13 ENCOUNTER — Other Ambulatory Visit: Payer: Self-pay | Admitting: Unknown Physician Specialty

## 2021-02-13 DIAGNOSIS — F419 Anxiety disorder, unspecified: Secondary | ICD-10-CM

## 2021-02-13 NOTE — Telephone Encounter (Signed)
  The original prescription was discontinued on 11/19/2020 by Lorre Munroe, NP. Renewing this prescription may not be appropriate      Requested Prescriptions  Pending Prescriptions Disp Refills   hydrOXYzine (ATARAX/VISTARIL) 25 MG tablet [Pharmacy Med Name: hydrOXYzine HCl 25 MG Oral Tablet] 90 tablet 0    Sig: Take 1 tablet by mouth three times daily as needed      Ear, Nose, and Throat:  Antihistamines Passed - 02/13/2021  2:35 PM      Passed - Valid encounter within last 12 months    Recent Outpatient Visits           2 months ago Anxiety and depression   [facility], Salvadore [place], NP   5 months ago Anxiety   [facility] [place], [place], NP   6 months ago Right otitis media, unspecified otitis media type   [facility], Jodelle Gross, FNP   6 months ago Right otitis media, unspecified otitis media type   [facility], Jodelle Gross, FNP   1 year ago Routine medical exam   [facility], Jodelle Gross, FNP

## 2021-02-14 NOTE — Telephone Encounter (Signed)
Can you call and verify her meds for me and d/c the ones she is not actually taking. She has sent emails with medication changes but nothing has been d/c' off the med list.

## 2021-02-15 ENCOUNTER — Other Ambulatory Visit: Payer: Self-pay | Admitting: Internal Medicine

## 2021-02-15 ENCOUNTER — Telehealth: Payer: Self-pay

## 2021-02-15 MED ORDER — HYDROXYZINE PAMOATE 50 MG PO CAPS: 50.0000 mg | ORAL_CAPSULE | Freq: Two times a day (BID) | ORAL | 0 refills | Status: DC | PRN

## 2021-02-15 NOTE — Telephone Encounter (Signed)
I attempted to contact the patient to update her medication list , no answer. I left a detail message on her voicemail.

## 2021-02-20 ENCOUNTER — Encounter: Payer: Self-pay | Admitting: Internal Medicine

## 2021-02-25 ENCOUNTER — Encounter: Payer: Self-pay | Admitting: Internal Medicine

## 2021-02-25 MED ORDER — BUSPIRONE HCL 10 MG PO TABS: 20.0000 mg | ORAL_TABLET | Freq: Three times a day (TID) | ORAL | 2 refills | Status: DC

## 2021-03-06 ENCOUNTER — Encounter: Payer: Self-pay | Admitting: Internal Medicine

## 2021-03-06 MED ORDER — BUPROPION HCL ER (XL) 300 MG PO TB24: 300.0000 mg | ORAL_TABLET | Freq: Every day | ORAL | 1 refills | Status: DC

## 2021-03-16 ENCOUNTER — Other Ambulatory Visit: Payer: Self-pay | Admitting: Internal Medicine

## 2021-03-16 NOTE — Telephone Encounter (Signed)
Requested Prescriptions  Pending Prescriptions Disp Refills  . hydrOXYzine (VISTARIL) 50 MG capsule [Pharmacy Med Name: hydrOXYzine Pamoate 50 MG Oral Capsule] 60 capsule 0    Sig: TAKE 1 CAPSULE BY MOUTH TWICE DAILY AS NEEDED     Ear, Nose, and Throat:  Antihistamines Passed - 03/16/2021  9:00 AM      Passed - Valid encounter within last 12 months    Recent Outpatient Visits          3 months ago Anxiety and depression   [facility] [place], Salvadore [place], NP   6 months ago Anxiety   [facility] [place], [place], NP   7 months ago Right otitis media, unspecified otitis media type   [facility], Jodelle Gross, FNP   7 months ago Right otitis media, unspecified otitis media type   [facility], Jodelle Gross, FNP   1 year ago Routine medical exam   [facility], Jodelle Gross, FNP

## 2021-03-18 ENCOUNTER — Other Ambulatory Visit: Payer: Self-pay | Admitting: Internal Medicine

## 2021-04-19 ENCOUNTER — Other Ambulatory Visit: Payer: Self-pay | Admitting: Internal Medicine

## 2021-04-19 NOTE — Telephone Encounter (Signed)
Requested medications are due for refill today Unsure  Requested medications are on the active medication list yes  Last refill 03/19/21  Last visit 11/19/20  Future visit scheduled no  Notes to clinic OV note states to stop Buspar and take Hydroxine BID for one week and then prn. Please assess if still supposed to be taking Buspar.

## 2021-04-30 ENCOUNTER — Other Ambulatory Visit (HOSPITAL_COMMUNITY)
Admission: RE | Admit: 2021-04-30 | Discharge: 2021-04-30 | Disposition: A | Discharge status: Home / Self Care | Payer: Self-pay | Source: Ambulatory Visit | Attending: Internal Medicine | Admitting: Internal Medicine

## 2021-04-30 ENCOUNTER — Other Ambulatory Visit: Payer: Self-pay

## 2021-04-30 ENCOUNTER — Ambulatory Visit: Payer: Self-pay | Admitting: Internal Medicine

## 2021-04-30 ENCOUNTER — Encounter: Payer: Self-pay | Admitting: Internal Medicine

## 2021-04-30 VITALS — BP 132/70 | HR 121 | Temp 98.2°F | Resp 18 | Ht 65.5 in | Wt 179.0 lb

## 2021-04-30 DIAGNOSIS — E785 Hyperlipidemia, unspecified: Secondary | ICD-10-CM | POA: Insufficient documentation

## 2021-04-30 DIAGNOSIS — R000 Tachycardia, unspecified: Secondary | ICD-10-CM

## 2021-04-30 DIAGNOSIS — Z124 Encounter for screening for malignant neoplasm of cervix: Secondary | ICD-10-CM

## 2021-04-30 MED ORDER — PROPRANOLOL HCL ER 60 MG PO CP24: 60.0000 mg | ORAL_CAPSULE | Freq: Every day | ORAL | 5 refills | Status: DC

## 2021-04-30 NOTE — Patient Instructions (Signed)
Health Maintenance, Female Adopting a healthy lifestyle and getting preventive care are important in promoting health and wellness. Ask your health care provider about: The right schedule for you to have regular tests and exams. Things you can do on your own to prevent diseases and keep yourself healthy. What should I know about diet, weight, and exercise? Eat a healthy diet  Eat a diet that includes plenty of vegetables, fruits, low-fat dairy products, and lean protein. Do not eat a lot of foods that are high in solid fats, added sugars, or sodium. Maintain a healthy weight Body mass index (BMI) is used to identify weight problems. It estimates body fat based on height and weight. Your health care provider can help determine your BMI and help you achieve or maintain a healthy weight. Get regular exercise Get regular exercise. This is one of the most important things you can do for your health. Most adults should: Exercise for at least 150 minutes each week. The exercise should increase your heart rate and make you sweat (moderate-intensity exercise). Do strengthening exercises at least twice a week. This is in addition to the moderate-intensity exercise. Spend less time sitting. Even light physical activity can be beneficial. Watch cholesterol and blood lipids Have your blood tested for lipids and cholesterol at 27 years of age, then have this test every 5 years. Have your cholesterol levels checked more often if: Your lipid or cholesterol levels are high. You are older than 27 years of age. You are at high risk for heart disease. What should I know about cancer screening? Depending on your health history and family history, you may need to have cancer screening at various ages. This may include screening for: Breast cancer. Cervical cancer. Colorectal cancer. Skin cancer. Lung cancer. What should I know about heart disease, diabetes, and high blood pressure? Blood pressure and heart  disease High blood pressure causes heart disease and increases the risk of stroke. This is more likely to develop in people who have high blood pressure readings, are of African descent, or are overweight. Have your blood pressure checked: Every 3-5 years if you are 18-39 years of age. Every year if you are 40 years old or older. Diabetes Have regular diabetes screenings. This checks your fasting blood sugar level. Have the screening done: Once every three years after age 40 if you are at a normal weight and have a low risk for diabetes. More often and at a younger age if you are overweight or have a high risk for diabetes. What should I know about preventing infection? Hepatitis B If you have a higher risk for hepatitis B, you should be screened for this virus. Talk with your health care provider to find out if you are at risk for hepatitis B infection. Hepatitis C Testing is recommended for: Everyone born from 1945 through 1965. Anyone with known risk factors for hepatitis C. Sexually transmitted infections (STIs) Get screened for STIs, including gonorrhea and chlamydia, if: You are sexually active and are younger than 27 years of age. You are older than 27 years of age and your health care provider tells you that you are at risk for this type of infection. Your sexual activity has changed since you were last screened, and you are at increased risk for chlamydia or gonorrhea. Ask your health care provider if you are at risk. Ask your health care provider about whether you are at high risk for HIV. Your health care provider may recommend a prescription medicine   to help prevent HIV infection. If you choose to take medicine to prevent HIV, you should first get tested for HIV. You should then be tested every 3 months for as long as you are taking the medicine. Pregnancy If you are about to stop having your period (premenopausal) and you may become pregnant, seek counseling before you get  pregnant. Take 400 to 800 micrograms (mcg) of folic acid every day if you become pregnant. Ask for birth control (contraception) if you want to prevent pregnancy. Osteoporosis and menopause Osteoporosis is a disease in which the bones lose minerals and strength with aging. This can result in bone fractures. If you are 65 years old or older, or if you are at risk for osteoporosis and fractures, ask your health care provider if you should: Be screened for bone loss. Take a calcium or vitamin D supplement to lower your risk of fractures. Be given hormone replacement therapy (HRT) to treat symptoms of menopause. Follow these instructions at home: Lifestyle Do not use any products that contain nicotine or tobacco, such as cigarettes, e-cigarettes, and chewing tobacco. If you need help quitting, ask your health care provider. Do not use street drugs. Do not share needles. Ask your health care provider for help if you need support or information about quitting drugs. Alcohol use Do not drink alcohol if: Your health care provider tells you not to drink. You are pregnant, may be pregnant, or are planning to become pregnant. If you drink alcohol: Limit how much you use to 0-1 drink a day. Limit intake if you are breastfeeding. Be aware of how much alcohol is in your drink. In the [place] one drink equals one 12 oz bottle of beer (355 mL), one 5 oz glass of wine (148 mL), or one 1 oz glass of hard liquor (44 mL). General instructions Schedule regular health, dental, and eye exams. Stay current with your vaccines. Tell your health care provider if: You often feel depressed. You have ever been abused or do not feel safe at home. Summary Adopting a healthy lifestyle and getting preventive care are important in promoting health and wellness. Follow your health care provider's instructions about healthy diet, exercising, and getting tested or screened for diseases. Follow your health care provider's  instructions on monitoring your cholesterol and blood pressure. This information is not intended to replace advice given to you by your health care provider. Make sure you discuss any questions you have with your health care provider. Document Revised: 09/14/2020 Document Reviewed: 06/30/2018 Elsevier Patient Education  2022 [employer]  

## 2021-04-30 NOTE — Assessment & Plan Note (Signed)
Will start Propranolol 60 mg ER daily

## 2021-04-30 NOTE — Progress Notes (Signed)
Subjective:    Patient ID: Sydney Clark, female    DOB: Feb 24, 1996, 25 y.o.   MRN: [id number]  HPI  Pt presents to the clinic today for her annual exam.  Flu: never Tetanus:  > 10 years ago Covid: never Pap Smear: never Dentist: as needed  Diet: She does eat meat. She consumes fruits and veggies. She tries to avoid fried foods. She drinks mostly sparkling water. Exercise: None  Review of Systems     Past Medical History:  Diagnosis Date   Anxiety    Depression     Current Outpatient Medications  Medication Sig Dispense Refill   albuterol (VENTOLIN HFA) 108 (90 Base) MCG/ACT inhaler TAKE 2 PUFFS BY MOUTH INTO THE LUNGS EVERY 6 HOURS AS NEEDED 8.5 each 0   ALPRAZolam (XANAX) 0.5 MG tablet Take 1-2 tabs PO 30 minutes- 1 hour prior to each flight 8 tablet 0   buPROPion (WELLBUTRIN XL) 300 MG 24 hr tablet Take 1 tablet (300 mg total) by mouth daily. 90 tablet 1   busPIRone (BUSPAR) 10 MG tablet Take 2 tablets (20 mg total) by mouth 3 (three) times daily. 180 tablet 2   dimenhyDRINATE (DRAMAMINE) 50 MG tablet Take 50 mg by mouth daily as needed.     hydrOXYzine (VISTARIL) 50 MG capsule TAKE 1 CAPSULE BY MOUTH TWICE DAILY AS NEEDED 60 capsule 0   omeprazole (PRILOSEC) 20 MG capsule Take 1 capsule (20 mg total) by mouth daily. 90 capsule 1   Current Facility-Administered Medications  Medication Dose Route Frequency Provider Last Rate Last Admin   ondansetron (ZOFRAN-ODT) disintegrating tablet 4 mg  4 mg Oral Once Gabriel Cirri, NP        No Known Allergies  Family History  Problem Relation Age of Onset   Cirrhosis Mother    Alcohol abuse Mother    Depression Mother    Alcohol abuse Father    Anxiety disorder Sister    Cancer Neg Hx    Heart attack Neg Hx    Stroke Neg Hx     Social History   Socioeconomic History   Marital status: Married    Spouse name: Not on file   Number of children: 0   Years of education: Not on file   Highest education level: 10th  grade  Occupational History   Not on file  Tobacco Use   Smoking status: Former    Packs/day: 0.50    Years: 5.00    Pack years: 2.50    Types: Cigarettes    Quit date: 04/28/2018    Years since quitting: 3.0   Smokeless tobacco: Former  Building services engineer Use: Every day   Substances: Nicotine, Flavoring  Substance and Sexual Activity   Alcohol use: No   Drug use: Yes    Types: Marijuana    Comment: last smoked this am   Sexual activity: Yes    Birth control/protection: None  Other Topics Concern   Not on file  Social History Narrative   Not on file   Social Determinants of Health   Financial Resource Strain: Not on file  Food Insecurity: Not on file  Transportation Needs: Not on file  Physical Activity: Not on file  Stress: Not on file  Social Connections: Not on file  Intimate Partner Violence: Not on file     Constitutional: Denies fever, malaise, fatigue, headache or abrupt weight changes.  HEENT: Denies eye pain, eye redness, ear pain, ringing in the ears,  wax buildup, runny nose, nasal congestion, bloody nose, or sore throat. Respiratory: Denies difficulty breathing, shortness of breath, cough or sputum production.   Cardiovascular: Denies chest pain, chest tightness, palpitations or swelling in the hands or feet.  Gastrointestinal: Denies abdominal pain, bloating, constipation, diarrhea or blood in the stool.  GU: Denies urgency, frequency, pain with urination, burning sensation, blood in urine, odor or discharge. Musculoskeletal: Denies decrease in range of motion, difficulty with gait, muscle pain or joint pain and swelling.  Skin: Denies redness, rashes, lesions or ulcercations.  Neurological: Denies dizziness, difficulty with memory, difficulty with speech or problems with balance and coordination.  Psych: Pt has a history of anxiety and depression. Denies SI/HI.  No other specific complaints in a complete review of systems (except as listed in HPI  above).  Objective:   Physical Exam   BP 132/70 (BP Location: Right Arm, Patient Position: Sitting, Cuff Size: Normal)   Pulse (!) 121   Temp 98.2 F (36.8 C) (Temporal)   Resp 18   Ht 5' 5.5" (1.664 m)   Wt 179 lb (81.2 kg)   LMP 04/01/2021   SpO2 100%   BMI 29.33 kg/m   Wt Readings from Last 3 Encounters:  11/19/20 176 lb (79.8 kg)  09/04/20 177 lb 6.4 oz (80.5 kg)  08/14/20 180 lb (81.6 kg)    General: Appears their stated age, overweight, in NAD. Skin: Warm, dry and intact. HEENT: Head: normal shape and size; Eyes: EOMs intact;  Neck:  Neck supple, trachea midline. No masses, lumps or thyromegaly present.  Cardiovascular: Normal rate and rhythm. S1,S2 noted.  No murmur, rubs or gallops noted. No JVD or BLE edema. No carotid bruits noted. Pulmonary/Chest: Normal effort and positive vesicular breath sounds. No respiratory distress. No wheezes, rales or ronchi noted.  Abdomen: Soft and nontender. Normal bowel sounds. No distention or masses noted. Pelvic: Normal female anatomy. Cervix without changes. No CMT noted. Adnexa nonpalpable. Musculoskeletal: Strength 5/5 BUE/BLE. No difficulty with gait.  Neurological: Alert and oriented. Cranial nerves II-XII grossly intact. Coordination normal.  Psychiatric: Mood and affect normal. Behavior is normal. Judgment and thought content normal.    BMET    Component Value Date/Time   NA 139 01/24/2020 1055   NA 140 07/06/2014 1143   K 4.5 01/24/2020 1055   K 3.9 07/06/2014 1143   CL 110 01/24/2020 1055   CL 107 07/06/2014 1143   CO2 25 01/24/2020 1055   CO2 23 07/06/2014 1143   GLUCOSE 92 01/24/2020 1055   GLUCOSE 102 (H) 07/06/2014 1143   BUN 16 01/24/2020 1055   BUN 9 07/06/2014 1143   CREATININE 0.92 01/24/2020 1055   CALCIUM 9.7 01/24/2020 1055   CALCIUM 9.5 07/06/2014 1143   GFRNONAA 87 01/24/2020 1055   GFRAA 100 01/24/2020 1055    Lipid Panel     Component Value Date/Time   CHOL 194 01/24/2020 1055   TRIG 193  (H) 01/24/2020 1055   HDL 44 (L) 01/24/2020 1055   CHOLHDL 4.4 01/24/2020 1055   LDLCALC 119 (H) 01/24/2020 1055    CBC    Component Value Date/Time   WBC 6.4 01/24/2020 1055   RBC 4.41 01/24/2020 1055   HGB 13.3 01/24/2020 1055   HGB 15.3 07/06/2014 1143   HCT 41.1 01/24/2020 1055   HCT 45.0 07/06/2014 1143   PLT 259 01/24/2020 1055   PLT 236 07/06/2014 1143   MCV 93.2 01/24/2020 1055   MCV 96 07/06/2014 1143   MCH  30.2 01/24/2020 1055   MCHC 32.4 01/24/2020 1055   RDW 12.7 01/24/2020 1055   RDW 12.4 07/06/2014 1143   LYMPHSABS 2,330 01/24/2020 1055   EOSABS 211 01/24/2020 1055   BASOSABS 32 01/24/2020 1055    Hgb A1C No results found for: HGBA1C         Assessment & Plan:   Preventative Health Maintenance:  She declines flu shot today She declines tetanus booster today Encouraged her to get her covid vaccine Pap smear today, she declines STD screening Encouraged here to consume a balanced diet and exercise regimen Advised her to see a dentist annually She declines labs today  RTC in 1 year, sooner if needed Nicki Reaper, NP This visit occurred during the SARS-CoV-2 public health emergency.  Safety protocols were in place, including screening questions prior to the visit, additional usage of staff PPE, and extensive cleaning of exam room while observing appropriate contact time as indicated for disinfecting solutions.

## 2021-05-03 LAB — CYTOLOGY - PAP
Adequacy: ABSENT
Diagnosis: NEGATIVE

## 2021-05-25 ENCOUNTER — Other Ambulatory Visit: Payer: Self-pay | Admitting: Internal Medicine

## 2021-05-25 NOTE — Telephone Encounter (Signed)
Requested Prescriptions  Pending Prescriptions Disp Refills  . hydrOXYzine (VISTARIL) 50 MG capsule [Pharmacy Med Name: hydrOXYzine Pamoate 50 MG Oral Capsule] 60 capsule 1    Sig: TAKE 1 CAPSULE BY MOUTH TWICE DAILY AS NEEDED     Ear, Nose, and Throat:  Antihistamines Passed - 05/25/2021  9:18 AM      Passed - Valid encounter within last 12 months    Recent Outpatient Visits          3 weeks ago Screening for cervical cancer   [facility] [place], Salvadore Oxford, NP   6 months ago Anxiety and depression   [facility] [place], Salvadore Oxford, NP   8 months ago Anxiety   [facility] Gabriel Cirri, NP   9 months ago Right otitis media, unspecified otitis media type   [facility], Jodelle Gross, FNP   10 months ago Right otitis media, unspecified otitis media type   [facility], Jodelle Gross, [place]

## 2021-06-21 ENCOUNTER — Other Ambulatory Visit: Payer: Self-pay | Admitting: Internal Medicine

## 2021-06-21 ENCOUNTER — Encounter: Payer: Self-pay | Admitting: Internal Medicine

## 2021-06-22 NOTE — Telephone Encounter (Signed)
Last RF 02/25/21  2 RF Needs appt. 30 say courtesy refill given. Sent pt MyChart message that appt. is due. No Future visit.  Requested Prescriptions  Pending Prescriptions Disp Refills   busPIRone (BUSPAR) 10 MG tablet [Pharmacy Med Name: busPIRone HCl 10 MG Oral Tablet] 180 tablet 0    Sig: TAKE 2 TABLETS BY MOUTH THREE TIMES DAILY     Psychiatry: Anxiolytics/Hypnotics - Non-controlled Passed - 06/21/2021  3:33 PM      Passed - Valid encounter within last 6 months    Recent Outpatient Visits           1 month ago Screening for cervical cancer   [facility] [place], Salvadore Oxford, NP   7 months ago Anxiety and depression   [facility] [place], Salvadore Oxford, NP   9 months ago Anxiety   [facility] Gabriel Cirri, NP   10 months ago Right otitis media, unspecified otitis media type   [facility], Jodelle Gross, FNP   11 months ago Right otitis media, unspecified otitis media type   [facility], Jodelle Gross, [place]

## 2021-06-24 NOTE — Telephone Encounter (Signed)
She is 5 months past due for her physical exam.  That is why her refill was denied.

## 2021-07-03 ENCOUNTER — Encounter: Payer: Self-pay | Admitting: Internal Medicine

## 2021-07-16 ENCOUNTER — Encounter: Payer: Self-pay | Admitting: Internal Medicine

## 2021-07-16 DIAGNOSIS — Z319 Encounter for procreative management, unspecified: Secondary | ICD-10-CM

## 2021-07-17 NOTE — Telephone Encounter (Signed)
Please review.  KP

## 2021-07-31 ENCOUNTER — Ambulatory Visit (INDEPENDENT_AMBULATORY_CARE_PROVIDER_SITE_OTHER): Payer: Self-pay | Admitting: Obstetrics & Gynecology

## 2021-07-31 ENCOUNTER — Encounter: Payer: Self-pay | Admitting: Obstetrics & Gynecology

## 2021-07-31 ENCOUNTER — Other Ambulatory Visit: Payer: Self-pay

## 2021-07-31 VITALS — BP 120/80 | Ht 66.0 in | Wt 180.0 lb

## 2021-07-31 DIAGNOSIS — Z3169 Encounter for other general counseling and advice on procreation: Secondary | ICD-10-CM

## 2021-07-31 NOTE — Progress Notes (Signed)
Gynecology  Exam  PCP: Jearld Fenton, NP  Chief Complaint: Desire for pregnancy  History of Present Illness: Patient is a 26 y.o. G0 presenting for evaluation of Desire for Pregnancy. Patient and female partner have reached a point to want to add pregnancy and baby into their lives.  She has never been pregnant before.  Desire insemination w donor sperm. Marital Status: married for a few years. Pregnancies with current partner not applicable  Menstrual and Endocrine History LMP: Patient's last menstrual period was 07/29/2021. Menarche:9 Shortest Interval: 24 Longest Interval: 29  days Duration of flow: 5 days Heavy Menses: no Clots: no Intermenstrual Bleeding: no Postcoital Bleeding: no Dysmenorrhea: no Amenorrhea: not applicable Wt Change: no Hirsutism: no Balding: no Acne: no Galactorrhea: no  Obstetrical History Never pregnant  Gynecologic History Last PAP: 2022 nml Previous abdominal or pelvic surgery: no Pelvic Pain:  no Endometriosis: no Hot Flashes: no DES Exposure: no Abnormal Pap: no Cervix Cryo/cone: no STD: no PID: no  Infertility and Endocrine Studies None  Sexual History Female partner/wife Never been pregnant  Contraception None  Family History Thyroid Problems: no Heart Condition or High Blood Pressure: no Blood Clot or Stroke: no Diabetes: no Cancer: no Birth Defects/Inherited diseases:no Infectious diseases (mumps, TB, Rubella):no Other Medical Problems: no MR/autism/fragile X or POF: no  Habits No smoking drugs or alcohol  PMHx: She  has a past medical history of Anxiety and Depression. Also,  has a past surgical history that includes Tonsillectomy; Adenoidectomy; and Myringotomy with tube placement., family history includes Alcohol abuse in her father and mother; Anxiety disorder in her sister; Cirrhosis in her mother; Depression in her mother.,  reports that she quit smoking about 3 years ago. Her smoking use included  cigarettes. She has a 2.50 pack-year smoking history. She has quit using smokeless tobacco. She reports current drug use. Drug: Marijuana. She reports that she does not drink alcohol.  She has a current medication list which includes the following prescription(s): albuterol, bupropion, dimenhydrinate, hydroxyzine, omeprazole, propranolol er, and buspirone. Also, has No Known Allergies.  Review of Systems  Constitutional:  Negative for chills, fever and malaise/fatigue.  HENT:  Negative for congestion, sinus pain and sore throat.   Eyes:  Negative for blurred vision and pain.  Respiratory:  Negative for cough and wheezing.   Cardiovascular:  Negative for chest pain and leg swelling.  Gastrointestinal:  Negative for abdominal pain, constipation, diarrhea, heartburn, nausea and vomiting.  Genitourinary:  Negative for dysuria, frequency, hematuria and urgency.  Musculoskeletal:  Negative for back pain, joint pain, myalgias and neck pain.  Skin:  Negative for itching and rash.  Neurological:  Negative for dizziness, tremors and weakness.  Endo/Heme/Allergies:  Does not bruise/bleed easily.  Psychiatric/Behavioral:  Negative for depression. The patient is not nervous/anxious and does not have insomnia.    Objective: BP 120/80    Ht 5' 6"  (1.676 m)    Wt 180 lb (81.6 kg)    LMP 07/29/2021    BMI 29.05 kg/m  Physical Exam Constitutional:      General: She is not in acute distress.    Appearance: She is well-developed.  Musculoskeletal:        General: Normal range of motion.  Neurological:     Mental Status: She is alert and oriented to person, place, and time.  Skin:    General: Skin is warm and dry.  Vitals reviewed.     Assessment: 26 y.o. Go  1. Encounter for preconception consultation -  PNV - Habits and exercise and lifestyle discussed - Ambulatory referral to Infertility for donor sperm insemination Counseled they may do labs or [place] there Consider Ovulation Predictor Kit for  info to give them Pt has no risk factors for pregnancy  Plan:  Preconception counseling - immunization up to date.  The patient denies any family history of conditions which would warrant preconception genetic counseling or testing on her or her partner.  Instructed to start prenatal vitamins while trying to conceive.    Barnett Applebaum, MD, [facility], [facility] 07/31/2021  9:15 AM

## 2021-09-03 ENCOUNTER — Telehealth: Payer: Self-pay | Admitting: Internal Medicine

## 2021-09-04 NOTE — Telephone Encounter (Signed)
Requested medication (s) are due for refill today - yes  Requested medication (s) are on the active medication list -yes  Future visit scheduled -no  Last refill: 03/06/21 #90 1RF  Notes to clinic: Request RF: fails lab protocol- last lab 2021  Requested Prescriptions  Pending Prescriptions Disp Refills   buPROPion (WELLBUTRIN XL) 300 MG 24 hr tablet [Pharmacy Med Name: buPROPion HCl ER (XL) 300 MG Oral Tablet Extended Release 24 Hour] 90 tablet 0    Sig: Take 1 tablet by mouth once daily     Psychiatry: Antidepressants - bupropion Failed - 09/03/2021 11:16 PM      Failed - Cr in normal range and within 360 days    Creat  Date Value Ref Range Status  01/24/2020 0.92 0.50 - 1.10 mg/dL Final          Failed - AST in normal range and within 360 days    AST  Date Value Ref Range Status  01/24/2020 24 10 - 30 U/L Final   SGOT(AST)  Date Value Ref Range Status  07/06/2014 28 15 - 37 Unit/L Final          Failed - ALT in normal range and within 360 days    ALT  Date Value Ref Range Status  01/24/2020 30 (H) 6 - 29 U/L Final   SGPT (ALT)  Date Value Ref Range Status  07/06/2014 29 U/L Final    Comment:    14-63 NOTE: New Reference Range 02/07/14           Passed - Completed PHQ-2 or PHQ-9 in the last 360 days      Passed - Last BP in normal range    BP Readings from Last 1 Encounters:  07/31/21 120/80          Passed - Valid encounter within last 6 months    Recent Outpatient Visits           4 months ago Screening for cervical cancer   [facility] [place], Salvadore Oxford, NP   9 months ago Anxiety and depression   [facility] [place], Salvadore Oxford, NP   1 year ago Anxiety   [facility] Gabriel Cirri, NP   1 year ago Right otitis media, unspecified otitis media type   [facility], Jodelle Gross, FNP   1 year ago Right otitis media, unspecified otitis media type   [facility], Jodelle Gross, [place]                 Requested Prescriptions  Pending Prescriptions Disp Refills   buPROPion (WELLBUTRIN XL) 300 MG 24 hr tablet [Pharmacy Med Name: buPROPion HCl ER (XL) 300 MG Oral Tablet Extended Release 24 Hour] 90 tablet 0    Sig: Take 1 tablet by mouth once daily     Psychiatry: Antidepressants - bupropion Failed - 09/03/2021 11:16 PM      Failed - Cr in normal range and within 360 days    Creat  Date Value Ref Range Status  01/24/2020 0.92 0.50 - 1.10 mg/dL Final          Failed - AST in normal range and within 360 days    AST  Date Value Ref Range Status  01/24/2020 24 10 - 30 U/L Final   SGOT(AST)  Date Value Ref Range Status  07/06/2014 28 15 - 37 Unit/L Final  Failed - ALT in normal range and within 360 days    ALT  Date Value Ref Range Status  01/24/2020 30 (H) 6 - 29 U/L Final   SGPT (ALT)  Date Value Ref Range Status  07/06/2014 29 U/L Final    Comment:    14-63 NOTE: New Reference Range 02/07/14           Passed - Completed PHQ-2 or PHQ-9 in the last 360 days      Passed - Last BP in normal range    BP Readings from Last 1 Encounters:  07/31/21 120/80          Passed - Valid encounter within last 6 months    Recent Outpatient Visits           4 months ago Screening for cervical cancer   [facility] [place], Salvadore Oxford, NP   9 months ago Anxiety and depression   [facility] [place], Salvadore Oxford, NP   1 year ago Anxiety   [facility] Gabriel Cirri, NP   1 year ago Right otitis media, unspecified otitis media type   [facility], Jodelle Gross, FNP   1 year ago Right otitis media, unspecified otitis media type   [facility], Jodelle Gross, [place]

## 2021-09-05 ENCOUNTER — Other Ambulatory Visit: Payer: Self-pay | Admitting: Internal Medicine

## 2021-09-05 ENCOUNTER — Encounter: Payer: Self-pay | Admitting: Internal Medicine

## 2021-09-05 MED ORDER — BUPROPION HCL ER (XL) 300 MG PO TB24: 300.0000 mg | ORAL_TABLET | Freq: Every day | ORAL | 0 refills | Status: DC

## 2021-09-05 NOTE — Telephone Encounter (Signed)
See My Chart Message.  This has been done already.    Thanks,   -Vernona Rieger

## 2021-09-05 NOTE — Telephone Encounter (Signed)
Pt took her last buPROPion (WELLBUTRIN XL) 300 MG 24 today, so she said she needs it filled asap. Please advise if we need to schedule follow up visit

## 2021-09-06 NOTE — Telephone Encounter (Signed)
Duplicate request

## 2021-09-06 NOTE — Telephone Encounter (Signed)
Duplicate request, signed yesterday.  Requested Prescriptions  Pending Prescriptions Disp Refills   buPROPion (WELLBUTRIN XL) 300 MG 24 hr tablet [Pharmacy Med Name: buPROPion HCl ER (XL) 300 MG Oral Tablet Extended Release 24 Hour] 90 tablet 0    Sig: Take 1 tablet by mouth once daily     Psychiatry: Antidepressants - bupropion Failed - 09/05/2021  1:36 PM      Failed - Cr in normal range and within 360 days    Creat  Date Value Ref Range Status  01/24/2020 0.92 0.50 - 1.10 mg/dL Final          Failed - AST in normal range and within 360 days    AST  Date Value Ref Range Status  01/24/2020 24 10 - 30 U/L Final   SGOT(AST)  Date Value Ref Range Status  07/06/2014 28 15 - 37 Unit/L Final          Failed - ALT in normal range and within 360 days    ALT  Date Value Ref Range Status  01/24/2020 30 (H) 6 - 29 U/L Final   SGPT (ALT)  Date Value Ref Range Status  07/06/2014 29 U/L Final    Comment:    14-63 NOTE: New Reference Range 02/07/14           Passed - Completed PHQ-2 or PHQ-9 in the last 360 days      Passed - Last BP in normal range    BP Readings from Last 1 Encounters:  07/31/21 120/80          Passed - Valid encounter within last 6 months    Recent Outpatient Visits           4 months ago Screening for cervical cancer   [facility] [place], Salvadore Oxford, NP   9 months ago Anxiety and depression   [facility] [place], Salvadore Oxford, NP   1 year ago Anxiety   [facility] Gabriel Cirri, NP   1 year ago Right otitis media, unspecified otitis media type   [facility], Jodelle Gross, FNP   1 year ago Right otitis media, unspecified otitis media type   [facility], Jodelle Gross, [place]

## 2021-09-06 NOTE — Telephone Encounter (Signed)
Was filled yesterday 

## 2021-09-19 ENCOUNTER — Ambulatory Visit: Payer: Self-pay | Admitting: Internal Medicine

## 2021-09-19 ENCOUNTER — Encounter: Payer: Self-pay | Admitting: Internal Medicine

## 2021-09-19 ENCOUNTER — Telehealth (INDEPENDENT_AMBULATORY_CARE_PROVIDER_SITE_OTHER): Payer: Self-pay | Admitting: Internal Medicine

## 2021-09-19 DIAGNOSIS — K219 Gastro-esophageal reflux disease without esophagitis: Secondary | ICD-10-CM

## 2021-09-19 DIAGNOSIS — R000 Tachycardia, unspecified: Secondary | ICD-10-CM

## 2021-09-19 DIAGNOSIS — F419 Anxiety disorder, unspecified: Secondary | ICD-10-CM

## 2021-09-19 DIAGNOSIS — F32A Depression, unspecified: Secondary | ICD-10-CM

## 2021-09-19 DIAGNOSIS — E782 Mixed hyperlipidemia: Secondary | ICD-10-CM

## 2021-09-19 MED ORDER — LABETALOL HCL 100 MG PO TABS: 50.0000 mg | ORAL_TABLET | Freq: Two times a day (BID) | ORAL | 2 refills | Status: DC

## 2021-09-19 NOTE — Progress Notes (Signed)
Virtual Visit via Video Note ? ?I connected with Sydney Clark on 09/19/21 at  4:00 PM EST by a video enabled telemedicine application and verified that I am speaking with the correct person using two identifiers. ? ?Location: ?Patient: Home ?Provider: Office ? ?Person's participating in this video call: Nicki Reaper, NP-C and [name]. ?  ?I discussed the limitations of evaluation and management by telemedicine and the availability of in person appointments. The patient expressed understanding and agreed to proceed. ? ?History of Present Illness: ? ?Pt wants to follow up chronic conditions.  She would like to review her meds in preparation for IUI as she recently started fertility work-up. ? ?GERD: She denies breakthrough on Omeprazole. There is no upper GI on file.  ? ?Tachycardia: Managed on Propranolol. ECG from 04/2018 reviewed. ? ?HLD: Her last LDL was 119, triglycerides 193, 01/2020. She is not taking any cholesterol lowering medications at this time. She does not consume a low fat diet. ? ?Anxiety and Depression: Chronic, managed on Buproprion and Hydroxyzine. She is not currently seeing a therapist. She denies SI/HI. ?  ?Past Medical History:  ?Diagnosis Date  ? Anxiety   ? Depression   ? ? ?Current Outpatient Medications  ?Medication Sig Dispense Refill  ? albuterol (VENTOLIN HFA) 108 (90 Base) MCG/ACT inhaler TAKE 2 PUFFS BY MOUTH INTO THE LUNGS EVERY 6 HOURS AS NEEDED 8.5 each 0  ? buPROPion (WELLBUTRIN XL) 300 MG 24 hr tablet Take 1 tablet (300 mg total) by mouth daily. 90 tablet 0  ? dimenhyDRINATE (DRAMAMINE) 50 MG tablet Take 50 mg by mouth daily as needed.    ? hydrOXYzine (VISTARIL) 50 MG capsule TAKE 1 CAPSULE BY MOUTH TWICE DAILY AS NEEDED 60 capsule 1  ? omeprazole (PRILOSEC) 20 MG capsule Take 1 capsule (20 mg total) by mouth daily. 90 capsule 1  ? propranolol ER (INDERAL LA) 60 MG 24 hr capsule Take 1 capsule (60 mg total) by mouth daily. 30 capsule 5  ? ?No current facility-administered  medications for this visit.  ? ? ?No Known Allergies ? ?Family History  ?Problem Relation Age of Onset  ? Cirrhosis Mother   ? Alcohol abuse Mother   ? Depression Mother   ? Alcohol abuse Father   ? Anxiety disorder Sister   ? Cancer Neg Hx   ? Heart attack Neg Hx   ? Stroke Neg Hx   ? ? ?Social History  ? ?Socioeconomic History  ? Marital status: Married  ?  Spouse name: Not on file  ? Number of children: 0  ? Years of education: Not on file  ? Highest education level: 10th grade  ?Occupational History  ? Not on file  ?Tobacco Use  ? Smoking status: Former  ?  Packs/day: 0.50  ?  Years: 5.00  ?  Pack years: 2.50  ?  Types: Cigarettes  ?  Quit date: 04/28/2018  ?  Years since quitting: 3.3  ? Smokeless tobacco: Former  ?Vaping Use  ? Vaping Use: Former  ? Substances: Nicotine, Flavoring  ?Substance and Sexual Activity  ? Alcohol use: No  ? Drug use: Yes  ?  Types: Marijuana  ?  Comment: last smoked this am  ? Sexual activity: Yes  ?  Birth control/protection: None  ?Other Topics Concern  ? Not on file  ?Social History Narrative  ? Not on file  ? ?Social Determinants of Health  ? ?Financial Resource Strain: Not on file  ?Food Insecurity: Not on  file  ?Transportation Needs: Not on file  ?Physical Activity: Not on file  ?Stress: Not on file  ?Social Connections: Not on file  ?Intimate Partner Violence: Not on file  ? ? ? ?Constitutional: Denies fever, malaise, fatigue, headache or abrupt weight changes.  ?Respiratory: Denies difficulty breathing, shortness of breath, cough or sputum production.   ?Cardiovascular: Denies chest pain, chest tightness, palpitations or swelling in the hands or feet.  ?Gastrointestinal: Denies abdominal pain, bloating, constipation, diarrhea or blood in the stool.  ?GU: Denies urgency, frequency, pain with urination, burning sensation, blood in urine, odor or discharge. ?Skin: Denies redness, rashes, lesions or ulcercations.  ?Neurological: Denies dizziness, difficulty with memory, difficulty  with speech or problems with balance and coordination.  ?Psych: Pt has a history of anxiety and depression. Denies SI/HI. ? ?No other specific complaints in a complete review of systems (except as listed in HPI above). ? ?Observations/Objective: ? ?Wt Readings from Last 3 Encounters:  ?07/31/21 180 lb (81.6 kg)  ?04/30/21 179 lb (81.2 kg)  ?11/19/20 176 lb (79.8 kg)  ? ? ?General: Appears her stated age, obese, in NAD. ?Skin: Warm, dry and intact.  ?Pulmonary/Chest: Normal effort. No respiratory distress.  ?Neurological: Alert and oriented.  ?Psychiatric: Mood and affect normal. Behavior is normal. Judgment and thought content normal.  ? ? ? ?BMET ?   ?Component Value Date/Time  ? NA 139 01/24/2020 1055  ? NA 140 07/06/2014 1143  ? K 4.5 01/24/2020 1055  ? K 3.9 07/06/2014 1143  ? CL 110 01/24/2020 1055  ? CL 107 07/06/2014 1143  ? CO2 25 01/24/2020 1055  ? CO2 23 07/06/2014 1143  ? GLUCOSE 92 01/24/2020 1055  ? GLUCOSE 102 (H) 07/06/2014 1143  ? BUN 16 01/24/2020 1055  ? BUN 9 07/06/2014 1143  ? CREATININE 0.92 01/24/2020 1055  ? CALCIUM 9.7 01/24/2020 1055  ? CALCIUM 9.5 07/06/2014 1143  ? GFRNONAA 87 01/24/2020 1055  ? GFRAA 100 01/24/2020 1055  ? ? ?Lipid Panel  ?   ?Component Value Date/Time  ? CHOL 194 01/24/2020 1055  ? TRIG 193 (H) 01/24/2020 1055  ? HDL 44 (L) 01/24/2020 1055  ? CHOLHDL 4.4 01/24/2020 1055  ? LDLCALC 119 (H) 01/24/2020 1055  ? ? ?CBC ?   ?Component Value Date/Time  ? WBC 6.4 01/24/2020 1055  ? RBC 4.41 01/24/2020 1055  ? HGB 13.3 01/24/2020 1055  ? HGB 15.3 07/06/2014 1143  ? HCT 41.1 01/24/2020 1055  ? HCT 45.0 07/06/2014 1143  ? PLT 259 01/24/2020 1055  ? PLT 236 07/06/2014 1143  ? MCV 93.2 01/24/2020 1055  ? MCV 96 07/06/2014 1143  ? MCH 30.2 01/24/2020 1055  ? MCHC 32.4 01/24/2020 1055  ? RDW 12.7 01/24/2020 1055  ? RDW 12.4 07/06/2014 1143  ? LYMPHSABS 2,330 01/24/2020 1055  ? EOSABS 211 01/24/2020 1055  ? BASOSABS 32 01/24/2020 1055  ? ? ?Hgb A1C ?No results found for:  HGBA1C ? ? ? ? ? ?Assessment and Plan: ? ? ?Follow Up Instructions: ? ?  ?I discussed the assessment and treatment plan with the patient. The patient was provided an opportunity to ask questions and all were answered. The patient agreed with the plan and demonstrated an understanding of the instructions. ?  ?The patient was advised to call back or seek an in-person evaluation if the symptoms worsen or if the condition fails to improve as anticipated. ? ? ?Nicki Reaper, NP ? ?

## 2021-09-20 DIAGNOSIS — K219 Gastro-esophageal reflux disease without esophagitis: Secondary | ICD-10-CM | POA: Insufficient documentation

## 2021-09-20 NOTE — Assessment & Plan Note (Signed)
Encourage weight loss as this can help reduce reflux symptoms ?Continue Omeprazole ?

## 2021-09-20 NOTE — Assessment & Plan Note (Signed)
She will continue Bupropion and Hydroxyzine as needed ?Support offered ?

## 2021-09-20 NOTE — Assessment & Plan Note (Signed)
Encourage low-fat diet ?We will hold off on cholesterol medications as she is actively trying to get pregnant ?

## 2021-09-20 NOTE — Assessment & Plan Note (Signed)
We will change Propanolol to Labetalol ?

## 2021-09-20 NOTE — Patient Instructions (Signed)
Pregnancy and the Partner's Role ?You have an important role during your partner's pregnancy, labor, and delivery. To be helpful and supportive during this time, you should know what to expect. ?What are the stages of pregnancy? ?Pregnancy usually lasts for about 40 weeks. The pregnancy period is divided into 3 trimesters. ?First trimester (1-12 weeks) ?During this trimester, your partner may: ?Feel tired. ?Have painful breasts. ?Feel nauseous and vomit. ?Urinate more often. ?Have mood changes. ?All of these changes are normal. Try to be helpful, supportive, and understanding. This may include not smoking around your partner and spending more time with each other. ?Second trimester (13-27 weeks) ?During this trimester: ?Your partner may feel better and more energetic. This is the best time to be more active together. ?You will be able to see her belly showing the pregnancy. ?You may be able to feel the baby kick. ?Your partner may have soreness or aching in her back as she gains weight. You can help by carrying heavy things and helping with household chores. ?Third trimester (28-40 weeks) ?During the final weeks, your partner may: ?Become more uncomfortable as the baby grows. ?Have a hard time doing everyday activities, and her balance may be off. ?Have a hard time bending over. ?Get tired easily. ?Have difficulty sleeping. ?At this time, the birth of your baby is close. You and your partner may have concerns or questions. This is normal. Talk with each other and with your health care provider. Continue to help your partner with household chores, encourage her to rest, and spend time together as a couple. ?How to support your partner during pregnancy ?There are many things you can do to support your partner during her pregnancy. ?Emotional support ?During your partner's pregnancy, you and your partner may: ?Feel happy, excited, or proud. ?Have concerns about finances or other new responsibilities. ?Feel overwhelmed  or scared. ?Worry about changes in your relationship. ?These feelings are normal. Talk about them openly with your partner, trusted family members, other new parents, or your health care provider. ?Find ways to relax and manage stress together, such as taking a walk or listening to music. ?Prenatal care ?Attend prenatal care visits with your partner. This is a good time for you to get to know your health care provider, follow the pregnancy, and ask questions. You may have prenatal visits: ?Once a month for the first 6 months. ?Once every 2 weeks from 6-8 months. ?Once a week during the last month. ?You may have more prenatal visits if your health care provider believes that they are needed. ?Sex and intimacy ?It's important to stay connected with your partner during pregnancy as your relationship may experience changes once the baby arrives. Sexual intercourse is safe unless there is a problem with the pregnancy and your partner's health care provider advises her not to have sex. Your partner may not want to have sex during certain times due to pregnancy-related physical and emotional changes. To continue to be intimate: ?Discuss your feelings and desires. ?Try different positions to make sex more comfortable. ?Find other ways to be intimate, such as massage. ?Talk with your partner's health care provider about any questions that you may have about sexual intercourse during pregnancy. ?Childbirth classes ?Attend childbirth classes with your partner. Classes help you and your partner bond by preparing for labor and delivery. Classes teach you: ?What happens during labor and delivery. ?How to emotionally and physically support your partner. ?Relaxation techniques. ?How to work with your partner during labor pains. ?How to  focus during labor and delivery. ?Follow these instructions at home: ?Talk with your partner about her preferences for labor and delivery. ?Find ways to support your partner's health during  pregnancy: ?Eat a healthy, well-balanced diet with your partner. ?Exercise with your partner. Exercising regularly during pregnancy can boost your partner's mood, reduce pain and swelling, and may even help prepare your partner to give birth. ?Make sure you are up to date on all recommended vaccines, such as the annual flu shot. ?Do not smoke or use e-cigarettes around your partner. When your partner breathes in smoke, the harmful substances that are inhaled can pass to your baby through the placenta. ?Summary ?To be helpful and supportive during your partner's pregnancy, you can provide emotional support, find ways to be intimate with your partner, and attend prenatal care visits and childbirth classes. ?Attending prenatal care visits with your partner helps you get to know your partner's health care provider, follow the pregnancy, and ask questions. ?It is important to talk with your partner and your partner's health care provider if you are worried or have any questions or concerns about the pregnancy. ?This information is not intended to replace advice given to you by your health care provider. Make sure you discuss any questions you have with your health care provider. ?Document Revised: 04/11/2020 Document Reviewed: 04/11/2020 ?Elsevier Patient Education ? 2022 [employer]vier [employer] ? ?

## 2021-10-03 ENCOUNTER — Encounter: Payer: Self-pay | Admitting: Internal Medicine

## 2021-10-04 MED ORDER — BUSPIRONE HCL 5 MG PO TABS: 5.0000 mg | ORAL_TABLET | Freq: Three times a day (TID) | ORAL | 0 refills | Status: DC | PRN

## 2021-10-20 ENCOUNTER — Encounter: Payer: Self-pay | Admitting: Internal Medicine

## 2021-10-21 ENCOUNTER — Ambulatory Visit: Payer: Self-pay

## 2021-10-21 NOTE — Telephone Encounter (Signed)
?  Chief Complaint: increased heart rate  ?Symptoms: HR 106-117 ?Frequency: 2 days ?Pertinent Negatives: Patient denies dizziness, chest pain, or SOB ?Disposition: [] ED /[] Urgent Care (no appt availability in office) / [] Appointment(In office/virtual)/ []  [facility]/ [x] Home Care/ [] Refused Recommended Disposition /[] [facility]/ []  Follow-up with PCP ?Additional Notes: pt states that she was taken off of propanolol and started labetalol on Saturday evening. Pt states she has noticed her HR being elevated. She has taken a buspar and states HR has came down. Advised her to monitor for a few days since she just started new medication and take the Buspar as needed and if no improvement to call back to schedule an appt. Pt just had IUI trying to conceive as well so anxiety is increased.  ?Summary: Fast heart rate  ? Patient called in stated that she have been messaging back and forth with and does not want to wait till tomorrow for an answer but stated that her heart rate has been rapid when checked with her smart watch its been between HR 106 and 117. Please advise no other symptoms reported but per patient she think it has to do with medication did not say which one. Please call   Ph#  [phone]   ?  ? ? ?Reason for Disposition ? Problems with anxiety or stress ? ?Answer Assessment - Initial Assessment Questions ?1. DESCRIPTION: "Please describe your heart rate or heartbeat that you are having" (e.g., fast/slow, regular/irregular, skipped or extra beats, "palpitations") ?    Fast  ?2. ONSET: "When did it start?" (Minutes, hours or days)  ?    sx ?4. PATTERN "Does it come and go, or has it been constant since it started?"  "Does it get worse with exertion?"   "Are you feeling it now?" ?    Comes and goes  ?6. HEART RATE: "Can you tell me your heart rate?" "How many beats in 15 seconds?"  (Note: not all patients can do this)   ?    106-117 ?8. CAUSE: "What do you think is causing  the palpitations?" ?    Started labetalol Saturday night  ?10. OTHER SYMPTOMS: "Do you have any other symptoms?" (e.g., dizziness, chest pain, sweating, difficulty breathing) ?      No ?11. PREGNANCY: "Is there any chance you are pregnant?" "When was your last menstrual period?" ?      Trying to get pregnant with IUI ? ?Protocols used: Heart Rate and Heartbeat Questions-A-AH ? ?

## 2021-10-23 ENCOUNTER — Encounter: Payer: Self-pay | Admitting: Internal Medicine

## 2021-10-23 MED ORDER — ONDANSETRON 4 MG PO TBDP: 4.0000 mg | ORAL_TABLET | Freq: Three times a day (TID) | ORAL | 0 refills | Status: DC | PRN

## 2021-11-15 ENCOUNTER — Other Ambulatory Visit: Payer: Self-pay | Admitting: Internal Medicine

## 2021-11-18 NOTE — Telephone Encounter (Signed)
Requested Prescriptions  ?Pending Prescriptions Disp Refills  ?? busPIRone (BUSPAR) 5 MG tablet [Pharmacy Med Name: busPIRone HCl 5 MG Oral Tablet] 90 tablet 0  ?  Sig: Take 1 tablet by mouth three times daily as needed  ?  ? Psychiatry: Anxiolytics/Hypnotics - Non-controlled Passed - 11/15/2021  4:36 PM  ?  ?  Passed - Valid encounter within last 12 months  ?  Recent Outpatient Visits   ?      ? 2 months ago Anxiety and depression  ? [facility] [place], [place] W, NP  ? 6 months ago Screening for cervical cancer  ? [facility] [place], [place] W, NP  ? 12 months ago Anxiety and depression  ? [facility] [place], Salvadore Oxford, NP  ? 1 year ago Anxiety  ? [facility] Gabriel Cirri, NP  ? 1 year ago Right otitis media, unspecified otitis media type  ? [facility], Jodelle Gross, FNP  ?  ?  ? ?  ?  ?  ? ?

## 2021-11-28 ENCOUNTER — Encounter: Payer: Self-pay | Admitting: Internal Medicine

## 2021-11-29 MED ORDER — BUSPIRONE HCL 7.5 MG PO TABS: 7.5000 mg | ORAL_TABLET | Freq: Three times a day (TID) | ORAL | 0 refills | Status: DC

## 2021-12-03 ENCOUNTER — Other Ambulatory Visit: Payer: Self-pay | Admitting: Internal Medicine

## 2021-12-04 NOTE — Telephone Encounter (Signed)
Requested medication (s) are due for refill today - yes ? ?Requested medication (s) are on the active medication list -yes ? ?Future visit scheduled -no ? ?Last refill: 09/05/21 #90 ? ?Notes to clinic: Fails lab protocol- over 1 year-2021 ? ?Requested Prescriptions  ?Pending Prescriptions Disp Refills  ? buPROPion (WELLBUTRIN XL) 300 MG 24 hr tablet [Pharmacy Med Name: buPROPion HCl ER (XL) 300 MG Oral Tablet Extended Release 24 Hour] 90 tablet 0  ?  Sig: Take 1 tablet by mouth once daily  ?  ? Psychiatry: Antidepressants - bupropion Failed - 12/03/2021  8:38 AM  ?  ?  Failed - Cr in normal range and within 360 days  ?  Creat  ?Date Value Ref Range Status  ?01/24/2020 0.92 0.50 - 1.10 mg/dL Final  ?   ?  ?  Failed - AST in normal range and within 360 days  ?  AST  ?Date Value Ref Range Status  ?01/24/2020 24 10 - 30 U/L Final  ? ?SGOT(AST)  ?Date Value Ref Range Status  ?07/06/2014 28 15 - 37 Unit/L Final  ?   ?  ?  Failed - ALT in normal range and within 360 days  ?  ALT  ?Date Value Ref Range Status  ?01/24/2020 30 (H) 6 - 29 U/L Final  ? ?SGPT (ALT)  ?Date Value Ref Range Status  ?07/06/2014 29 U/L Final  ?  Comment:  ?  14-63 ?NOTE: New Reference Range ?02/07/14 ?  ?   ?  ?  Passed - Completed PHQ-2 or PHQ-9 in the last 360 days  ?  ?  Passed - Last BP in normal range  ?  BP Readings from Last 1 Encounters:  ?07/31/21 120/80  ?   ?  ?  Passed - Valid encounter within last 6 months  ?  Recent Outpatient Visits   ? ?      ? 2 months ago Anxiety and depression  ? [facility] [place], [place] W, NP  ? 7 months ago Screening for cervical cancer  ? [facility] [place], Salvadore Oxford, NP  ? 1 year ago Anxiety and depression  ? [facility] [place], Salvadore Oxford, NP  ? 1 year ago Anxiety  ? [facility] Gabriel Cirri, NP  ? 1 year ago Right otitis media, unspecified otitis media type  ? [facility], Jodelle Gross, FNP  ? ?  ?  ? ? ?  ?  ?  ? ? ? ?Requested  Prescriptions  ?Pending Prescriptions Disp Refills  ? buPROPion (WELLBUTRIN XL) 300 MG 24 hr tablet [Pharmacy Med Name: buPROPion HCl ER (XL) 300 MG Oral Tablet Extended Release 24 Hour] 90 tablet 0  ?  Sig: Take 1 tablet by mouth once daily  ?  ? Psychiatry: Antidepressants - bupropion Failed - 12/03/2021  8:38 AM  ?  ?  Failed - Cr in normal range and within 360 days  ?  Creat  ?Date Value Ref Range Status  ?01/24/2020 0.92 0.50 - 1.10 mg/dL Final  ?   ?  ?  Failed - AST in normal range and within 360 days  ?  AST  ?Date Value Ref Range Status  ?01/24/2020 24 10 - 30 U/L Final  ? ?SGOT(AST)  ?Date Value Ref Range Status  ?07/06/2014 28 15 - 37 Unit/L Final  ?   ?  ?  Failed - ALT in normal range and within 360  days  ?  ALT  ?Date Value Ref Range Status  ?01/24/2020 30 (H) 6 - 29 U/L Final  ? ?SGPT (ALT)  ?Date Value Ref Range Status  ?07/06/2014 29 U/L Final  ?  Comment:  ?  14-63 ?NOTE: New Reference Range ?02/07/14 ?  ?   ?  ?  Passed - Completed PHQ-2 or PHQ-9 in the last 360 days  ?  ?  Passed - Last BP in normal range  ?  BP Readings from Last 1 Encounters:  ?07/31/21 120/80  ?   ?  ?  Passed - Valid encounter within last 6 months  ?  Recent Outpatient Visits   ? ?      ? 2 months ago Anxiety and depression  ? [facility] [place], [place] W, NP  ? 7 months ago Screening for cervical cancer  ? [facility] [place], Salvadore Oxford, NP  ? 1 year ago Anxiety and depression  ? [facility] [place], Salvadore Oxford, NP  ? 1 year ago Anxiety  ? [facility] Gabriel Cirri, NP  ? 1 year ago Right otitis media, unspecified otitis media type  ? [facility], Jodelle Gross, FNP  ? ?  ?  ? ? ?  ?  ?  ? ? ? ?

## 2021-12-05 ENCOUNTER — Other Ambulatory Visit: Payer: Self-pay | Admitting: Internal Medicine

## 2021-12-05 ENCOUNTER — Encounter: Payer: Self-pay | Admitting: Internal Medicine

## 2021-12-05 MED ORDER — BUPROPION HCL ER (XL) 300 MG PO TB24
300.0000 mg | ORAL_TABLET | Freq: Every day | ORAL | 0 refills | Status: DC
Start: 2021-12-05 — End: 2022-02-25

## 2021-12-05 NOTE — Telephone Encounter (Signed)
LMTCB 12/05/2021.  PEC please advise when she calls back.     Thanks,   -Vernona Rieger

## 2021-12-05 NOTE — Telephone Encounter (Signed)
I don't think she is taking this, can you call and verify?

## 2021-12-10 ENCOUNTER — Ambulatory Visit: Payer: Self-pay | Admitting: Internal Medicine

## 2021-12-11 ENCOUNTER — Telehealth (INDEPENDENT_AMBULATORY_CARE_PROVIDER_SITE_OTHER): Payer: Self-pay | Admitting: Internal Medicine

## 2021-12-11 ENCOUNTER — Encounter: Payer: Self-pay | Admitting: Internal Medicine

## 2021-12-11 DIAGNOSIS — F419 Anxiety disorder, unspecified: Secondary | ICD-10-CM

## 2021-12-11 DIAGNOSIS — F32A Depression, unspecified: Secondary | ICD-10-CM

## 2021-12-11 MED ORDER — BUSPIRONE HCL 10 MG PO TABS: 10.0000 mg | ORAL_TABLET | Freq: Three times a day (TID) | ORAL | 0 refills | Status: DC

## 2021-12-11 NOTE — Assessment & Plan Note (Signed)
She will continue Bupropion We will increase Buspirone to 10 mg 3 times daily Support offered

## 2021-12-11 NOTE — Progress Notes (Signed)
Virtual Visit via Video Note  I connected with Sydney Clark on 12/11/21 at 11:20 AM EDT by a video enabled telemedicine application and verified that I am speaking with the correct person using two identifiers.  Location: Patient: Home Provider: Office  Persons participating in this video call: Nicki Reaper, NP and Ricky Ala   I discussed the limitations of evaluation and management by telemedicine and the availability of in person appointments. The patient expressed understanding and agreed to proceed.  History of Present Illness:  Patient wanting to follow-up anxiety and depression.  This is currently managed on Bupropion, Buspirone.  She does feel like her anxiety is worse lately due to a failed IUI procedure.  She reports her anxiety is causing lack of appetite.  She has lost 25 pounds.  She is not currently seeing a therapist.  She denies SI/HI.  Past Medical History:  Diagnosis Date   Anxiety    Depression     Current Outpatient Medications  Medication Sig Dispense Refill   albuterol (VENTOLIN HFA) 108 (90 Base) MCG/ACT inhaler TAKE 2 PUFFS BY MOUTH INTO THE LUNGS EVERY 6 HOURS AS NEEDED 8.5 each 0   buPROPion (WELLBUTRIN XL) 300 MG 24 hr tablet Take 1 tablet (300 mg total) by mouth daily. 90 tablet 0   busPIRone (BUSPAR) 7.5 MG tablet Take 1 tablet (7.5 mg total) by mouth 3 (three) times daily. 90 tablet 0   dimenhyDRINATE (DRAMAMINE) 50 MG tablet Take 50 mg by mouth daily as needed.     hydrOXYzine (VISTARIL) 50 MG capsule TAKE 1 CAPSULE BY MOUTH TWICE DAILY AS NEEDED 60 capsule 1   labetalol (NORMODYNE) 100 MG tablet Take 0.5 tablets (50 mg total) by mouth 2 (two) times daily. 30 tablet 2   omeprazole (PRILOSEC) 20 MG capsule Take 1 capsule (20 mg total) by mouth daily. 90 capsule 1   ondansetron (ZOFRAN-ODT) 4 MG disintegrating tablet Take 1 tablet (4 mg total) by mouth every 8 (eight) hours as needed for nausea or vomiting. 30 tablet 0   propranolol ER (INDERAL LA) 60  MG 24 hr capsule Take 1 capsule (60 mg total) by mouth daily. 30 capsule 5   No current facility-administered medications for this visit.    No Known Allergies  Family History  Problem Relation Age of Onset   Cirrhosis Mother    Alcohol abuse Mother    Depression Mother    Alcohol abuse Father    Anxiety disorder Sister    Cancer Neg Hx    Heart attack Neg Hx    Stroke Neg Hx     Social History   Socioeconomic History   Marital status: Married    Spouse name: Not on file   Number of children: 0   Years of education: Not on file   Highest education level: 10th grade  Occupational History   Not on file  Tobacco Use   Smoking status: Former    Packs/day: 0.50    Years: 5.00    Pack years: 2.50    Types: Cigarettes    Quit date: 04/28/2018    Years since quitting: 3.6   Smokeless tobacco: Former  Building services engineer Use: Former   Substances: Nicotine, Flavoring  Substance and Sexual Activity   Alcohol use: No   Drug use: Yes    Types: Marijuana    Comment: last smoked this am   Sexual activity: Yes    Birth control/protection: None  Other Topics Concern  Not on file  Social History Narrative   Not on file   Social Determinants of Health   Financial Resource Strain: Not on file  Food Insecurity: Not on file  Transportation Needs: Not on file  Physical Activity: Not on file  Stress: Not on file  Social Connections: Not on file  Intimate Partner Violence: Not on file     Constitutional: Denies fever, malaise, fatigue, headache.  Respiratory: Denies difficulty breathing, shortness of breath, cough or sputum production.   Cardiovascular: Denies chest pain, chest tightness, palpitations or swelling in the hands or feet.  Neurological: Denies dizziness, difficulty with memory, difficulty with speech or problems with balance and coordination.  Psych: Patient reports anxiety and depression.  Denies SI/HI.  No other specific complaints in a complete review of  systems (except as listed in HPI above).  Observations/Objective:   Wt Readings from Last 3 Encounters:  07/31/21 180 lb (81.6 kg)  04/30/21 179 lb (81.2 kg)  11/19/20 176 lb (79.8 kg)    General: Appears her stated age, in NAD. Pulmonary/Chest: Normal effort. No respiratory distress.  Neurological: Alert and oriented.  Psychiatric: Mood and affect normal.  Mildly anxious appearing. Judgment and thought content normal.    BMET    Component Value Date/Time   NA 139 01/24/2020 1055   NA 140 07/06/2014 1143   K 4.5 01/24/2020 1055   K 3.9 07/06/2014 1143   CL 110 01/24/2020 1055   CL 107 07/06/2014 1143   CO2 25 01/24/2020 1055   CO2 23 07/06/2014 1143   GLUCOSE 92 01/24/2020 1055   GLUCOSE 102 (H) 07/06/2014 1143   BUN 16 01/24/2020 1055   BUN 9 07/06/2014 1143   CREATININE 0.92 01/24/2020 1055   CALCIUM 9.7 01/24/2020 1055   CALCIUM 9.5 07/06/2014 1143   GFRNONAA 87 01/24/2020 1055   GFRAA 100 01/24/2020 1055    Lipid Panel     Component Value Date/Time   CHOL 194 01/24/2020 1055   TRIG 193 (H) 01/24/2020 1055   HDL 44 (L) 01/24/2020 1055   CHOLHDL 4.4 01/24/2020 1055   LDLCALC 119 (H) 01/24/2020 1055    CBC    Component Value Date/Time   WBC 6.4 01/24/2020 1055   RBC 4.41 01/24/2020 1055   HGB 13.3 01/24/2020 1055   HGB 15.3 07/06/2014 1143   HCT 41.1 01/24/2020 1055   HCT 45.0 07/06/2014 1143   PLT 259 01/24/2020 1055   PLT 236 07/06/2014 1143   MCV 93.2 01/24/2020 1055   MCV 96 07/06/2014 1143   MCH 30.2 01/24/2020 1055   MCHC 32.4 01/24/2020 1055   RDW 12.7 01/24/2020 1055   RDW 12.4 07/06/2014 1143   LYMPHSABS 2,330 01/24/2020 1055   EOSABS 211 01/24/2020 1055   BASOSABS 32 01/24/2020 1055    Hgb A1C No results found for: HGBA1C     Assessment and Plan:   Follow Up Instructions:    I discussed the assessment and treatment plan with the patient. The patient was provided an opportunity to ask questions and all were answered. The  patient agreed with the plan and demonstrated an understanding of the instructions.   The patient was advised to call back or seek an in-person evaluation if the symptoms worsen or if the condition fails to improve as anticipated.    Nicki Reaper, NP

## 2021-12-11 NOTE — Patient Instructions (Signed)

## 2021-12-26 ENCOUNTER — Encounter: Payer: Self-pay | Admitting: Internal Medicine

## 2021-12-26 DIAGNOSIS — J9801 Acute bronchospasm: Secondary | ICD-10-CM

## 2021-12-26 MED ORDER — ALBUTEROL SULFATE HFA 108 (90 BASE) MCG/ACT IN AERS: INHALATION_SPRAY | RESPIRATORY_TRACT | 1 refills | Status: DC

## 2022-01-11 ENCOUNTER — Other Ambulatory Visit: Payer: Self-pay | Admitting: Internal Medicine

## 2022-02-21 ENCOUNTER — Encounter: Payer: Self-pay | Admitting: Internal Medicine

## 2022-02-24 ENCOUNTER — Other Ambulatory Visit: Payer: Self-pay | Admitting: Internal Medicine

## 2022-02-25 NOTE — Telephone Encounter (Signed)
Requested Prescriptions  Pending Prescriptions Disp Refills  . buPROPion (WELLBUTRIN XL) 300 MG 24 hr tablet [Pharmacy Med Name: buPROPion HCl ER (XL) 300 MG Oral Tablet Extended Release 24 Hour] 90 tablet 0    Sig: Take 1 tablet by mouth once daily     Psychiatry: Antidepressants - bupropion Failed - 02/24/2022  9:25 AM      Failed - Cr in normal range and within 360 days    Creat  Date Value Ref Range Status  01/24/2020 0.92 0.50 - 1.10 mg/dL Final         Failed - AST in normal range and within 360 days    AST  Date Value Ref Range Status  01/24/2020 24 10 - 30 U/L Final   SGOT(AST)  Date Value Ref Range Status  07/06/2014 28 15 - 37 Unit/L Final         Failed - ALT in normal range and within 360 days    ALT  Date Value Ref Range Status  01/24/2020 30 (H) 6 - 29 U/L Final   SGPT (ALT)  Date Value Ref Range Status  07/06/2014 29 U/L Final    Comment:    14-63 NOTE: New Reference Range 02/07/14          Passed - Completed PHQ-2 or PHQ-9 in the last 360 days      Passed - Last BP in normal range    BP Readings from Last 1 Encounters:  07/31/21 120/80         Passed - Valid encounter within last 6 months    Recent Outpatient Visits          2 months ago Anxiety and depression   [facility] [place], Salvadore Oxford, NP   5 months ago Anxiety and depression   [facility] [place], Salvadore Oxford, NP   10 months ago Screening for cervical cancer   [facility] [place], Salvadore Oxford, NP   1 year ago Anxiety and depression   [facility] [place], Salvadore Oxford, NP   1 year ago Anxiety   [facility] Gabriel Cirri, NP

## 2022-03-07 ENCOUNTER — Encounter: Payer: Self-pay | Admitting: Internal Medicine

## 2022-03-25 ENCOUNTER — Ambulatory Visit: Payer: Self-pay | Admitting: Internal Medicine

## 2022-03-25 ENCOUNTER — Encounter: Payer: Self-pay | Admitting: Internal Medicine

## 2022-03-25 VITALS — BP 104/60 | HR 94 | Temp 97.1°F | Wt 147.0 lb

## 2022-03-25 DIAGNOSIS — Z0001 Encounter for general adult medical examination with abnormal findings: Secondary | ICD-10-CM

## 2022-03-25 MED ORDER — BUSPIRONE HCL 10 MG PO TABS: 20.0000 mg | ORAL_TABLET | Freq: Three times a day (TID) | ORAL | 0 refills | Status: DC

## 2022-03-25 NOTE — Patient Instructions (Signed)

## 2022-03-25 NOTE — Progress Notes (Signed)
Subjective:    Patient ID: Sydney Clark, female    DOB: 28-Jun-1996, 28 y.o.   MRN: [id number]  HPI  Patient presents to clinic today for her annual exam.  Flu: never Tetanus: > 10 years ago COVID: never Pap smear: 04/2021 Dentist: as needed  Diet: She does eat some meat. She consumes fruits and veggies. She tries to avoid fried foods. She drinks mostly sparkling water. Exercise: Walking the dog.  Review of Systems  Past Medical History:  Diagnosis Date   Anxiety    Depression     Current Outpatient Medications  Medication Sig Dispense Refill   albuterol (VENTOLIN HFA) 108 (90 Base) MCG/ACT inhaler TAKE 2 PUFFS BY MOUTH INTO THE LUNGS EVERY 6 HOURS AS NEEDED 8.5 each 1   buPROPion (WELLBUTRIN XL) 300 MG 24 hr tablet Take 1 tablet by mouth once daily 90 tablet 0   busPIRone (BUSPAR) 10 MG tablet Take 1 tablet (10 mg total) by mouth 3 (three) times daily. 270 tablet 0   dimenhyDRINATE (DRAMAMINE) 50 MG tablet Take 50 mg by mouth daily as needed.     labetalol (NORMODYNE) 100 MG tablet Take 1/2 (one-half) tablet by mouth twice daily 90 tablet 1   omeprazole (PRILOSEC) 20 MG capsule Take 1 capsule (20 mg total) by mouth daily. 90 capsule 1   ondansetron (ZOFRAN-ODT) 4 MG disintegrating tablet Take 1 tablet (4 mg total) by mouth every 8 (eight) hours as needed for nausea or vomiting. 30 tablet 0   No current facility-administered medications for this visit.    No Known Allergies  Family History  Problem Relation Age of Onset   Cirrhosis Mother    Alcohol abuse Mother    Depression Mother    Alcohol abuse Father    Anxiety disorder Sister    Cancer Neg Hx    Heart attack Neg Hx    Stroke Neg Hx     Social History   Socioeconomic History   Marital status: Married    Spouse name: Not on file   Number of children: 0   Years of education: Not on file   Highest education level: 10th grade  Occupational History   Not on file  Tobacco Use   Smoking status: Former     Packs/day: 0.50    Years: 5.00    Total pack years: 2.50    Types: Cigarettes    Quit date: 04/28/2018    Years since quitting: 3.9   Smokeless tobacco: Former  Scientific laboratory technician Use: Former   Substances: Nicotine, Flavoring  Substance and Sexual Activity   Alcohol use: No   Drug use: Yes    Types: Marijuana    Comment: last smoked this am   Sexual activity: Yes    Birth control/protection: None  Other Topics Concern   Not on file  Social History Narrative   Not on file   Social Determinants of Health   Financial Resource Strain: Medium Risk (09/21/2018)   Overall Financial Resource Strain (CARDIA)    Difficulty of Paying Living Expenses: Somewhat hard  Food Insecurity: Food Insecurity Present (09/21/2018)   Hunger Vital Sign    Worried About Running Out of Food in the Last Year: Sometimes true    Ran Out of Food in the Last Year: Sometimes true  Transportation Needs: Unmet Transportation Needs (09/21/2018)   PRAPARE - Hydrologist (Medical): Yes    Lack of Transportation (Non-Medical): Yes  Physical  Activity: Inactive (09/21/2018)   Exercise Vital Sign    Days of Exercise per Week: 0 days    Minutes of Exercise per Session: 0 min  Stress: Not on file  Social Connections: Not on file  Intimate Partner Violence: Not At Risk (09/21/2018)   Humiliation, Afraid, Rape, and Kick questionnaire    Fear of Current or Ex-Partner: No    Emotionally Abused: No    Physically Abused: No    Sexually Abused: No     Constitutional: Pt reports unintentional weight loss. Denies fever, malaise, fatigue, headache or abrupt weight changes.  HEENT: Pt reports intermittent right ear pain. Denies eye pain, eye redness, ear pain, ringing in the ears, wax buildup, runny nose, nasal congestion, bloody nose, or sore throat. Respiratory: Denies difficulty breathing, shortness of breath, cough or sputum production.   Cardiovascular: Denies chest pain, chest tightness,  palpitations or swelling in the hands or feet.  Gastrointestinal: Pt reports lack of appetite, intermittent reflux. Denies abdominal pain, bloating, constipation, diarrhea or blood in the stool.  GU: Denies urgency, frequency, pain with urination, burning sensation, blood in urine, odor or discharge. Musculoskeletal: Denies decrease in range of motion, difficulty with gait, muscle pain or joint pain and swelling.  Skin: Denies redness, rashes, lesions or ulcercations.  Neurological: Pt reports intermittent dizziness. Denies difficulty with memory, difficulty with speech or problems with balance and coordination.  Psych: Patient has a history of anxiety and depression.  Denies SI/HI.  No other specific complaints in a complete review of systems (except as listed in HPI above).     Objective:   Physical Exam  BP 104/60 (BP Location: Left Arm, Patient Position: Sitting, Cuff Size: Normal)   Pulse 94   Temp (!) 97.1 F (36.2 C) (Temporal)   Wt 147 lb (66.7 kg)   SpO2 96%   BMI 23.73 kg/m   Wt Readings from Last 3 Encounters:  07/31/21 180 lb (81.6 kg)  04/30/21 179 lb (81.2 kg)  11/19/20 176 lb (79.8 kg)    General: Appears her stated age, well developed, well nourished in NAD. Skin: Warm, dry and intact.  HEENT: Head: normal shape and size; Eyes: sclera white, no icterus, conjunctiva pink, PERRLA and EOMs intact; Ears: Tm's gray and intact, scarring noted of b/l TM, + serous effusion on the right; Neck:  Neck supple, trachea midline. No masses, lumps or thyromegaly present.  Cardiovascular: Normal rate and rhythm. S1,S2 noted.  No murmur, rubs or gallops noted. No JVD or BLE edema.  Pulmonary/Chest: Normal effort and positive vesicular breath sounds. No respiratory distress. No wheezes, rales or ronchi noted.  Abdomen: Normal bowel sounds.  Musculoskeletal: Strength 5/5 BUE/BLE. No difficulty with gait.  Neurological: Alert and oriented. Cranial nerves II-XII grossly intact.  Coordination normal.  Psychiatric: Mood and affect flat. Behavior is normal. Judgment and thought content normal.    BMET    Component Value Date/Time   NA 139 01/24/2020 1055   NA 140 07/06/2014 1143   K 4.5 01/24/2020 1055   K 3.9 07/06/2014 1143   CL 110 01/24/2020 1055   CL 107 07/06/2014 1143   CO2 25 01/24/2020 1055   CO2 23 07/06/2014 1143   GLUCOSE 92 01/24/2020 1055   GLUCOSE 102 (H) 07/06/2014 1143   BUN 16 01/24/2020 1055   BUN 9 07/06/2014 1143   CREATININE 0.92 01/24/2020 1055   CALCIUM 9.7 01/24/2020 1055   CALCIUM 9.5 07/06/2014 1143   GFRNONAA 87 01/24/2020 1055   GFRAA  100 01/24/2020 1055    Lipid Panel     Component Value Date/Time   CHOL 194 01/24/2020 1055   TRIG 193 (H) 01/24/2020 1055   HDL 44 (L) 01/24/2020 1055   CHOLHDL 4.4 01/24/2020 1055   LDLCALC 119 (H) 01/24/2020 1055    CBC    Component Value Date/Time   WBC 6.4 01/24/2020 1055   RBC 4.41 01/24/2020 1055   HGB 13.3 01/24/2020 1055   HGB 15.3 07/06/2014 1143   HCT 41.1 01/24/2020 1055   HCT 45.0 07/06/2014 1143   PLT 259 01/24/2020 1055   PLT 236 07/06/2014 1143   MCV 93.2 01/24/2020 1055   MCV 96 07/06/2014 1143   MCH 30.2 01/24/2020 1055   MCHC 32.4 01/24/2020 1055   RDW 12.7 01/24/2020 1055   RDW 12.4 07/06/2014 1143   LYMPHSABS 2,330 01/24/2020 1055   EOSABS 211 01/24/2020 1055   BASOSABS 32 01/24/2020 1055    Hgb A1C No results found for: "HGBA1C"         Assessment & Plan:   Preventative Health Maintenance:  She declines flu shot today She declines tetanus booster Encouraged her to get her COVID-vaccine Pap smear UTD Encouraged her to consume a balanced diet and exercise regimen Advised her to see an eye doctor and dentist annually We will check CBC, c-Met, lipid, A1c and Vit D per her request  RTC in 6 months, follow-up chronic conditions Webb Silversmith, NP

## 2022-03-26 LAB — COMPLETE METABOLIC PANEL WITH GFR
AG Ratio: 2.3 (calc) (ref 1.0–2.5)
ALT: 20 U/L (ref 6–29)
AST: 15 U/L (ref 10–30)
Albumin: 5.2 g/dL — ABNORMAL HIGH (ref 3.6–5.1)
Alkaline phosphatase (APISO): 43 U/L (ref 31–125)
BUN/Creatinine Ratio: 16 (calc) (ref 6–22)
BUN: 16 mg/dL (ref 7–25)
CO2: 25 mmol/L (ref 20–32)
Calcium: 10 mg/dL (ref 8.6–10.2)
Chloride: 105 mmol/L (ref 98–110)
Creat: 1 mg/dL — ABNORMAL HIGH (ref 0.50–0.96)
Globulin: 2.3 g/dL (calc) (ref 1.9–3.7)
Glucose, Bld: 97 mg/dL (ref 65–99)
Potassium: 4.7 mmol/L (ref 3.5–5.3)
Sodium: 141 mmol/L (ref 135–146)
Total Bilirubin: 0.4 mg/dL (ref 0.2–1.2)
Total Protein: 7.5 g/dL (ref 6.1–8.1)
eGFR: 79 mL/min/{1.73_m2} (ref >=60)

## 2022-03-26 LAB — CBC
HCT: 41 % (ref 35.0–45.0)
Hemoglobin: 14 g/dL (ref 11.7–15.5)
MCH: 32.5 pg (ref 27.0–33.0)
MCHC: 34.1 g/dL (ref 32.0–36.0)
MCV: 95.1 fL (ref 80.0–100.0)
MPV: 9.2 fL (ref 7.5–12.5)
Platelets: 240 10*3/uL (ref 140–400)
RBC: 4.31 10*6/uL (ref 3.80–5.10)
RDW: 11.3 % (ref 11.0–15.0)
WBC: 4.9 10*3/uL (ref 3.8–10.8)

## 2022-03-26 LAB — LIPID PANEL
Cholesterol: 158 mg/dL (ref <200)
HDL: 53 mg/dL (ref >=50)
LDL Cholesterol (Calc): 88 mg/dL (calc)
Non-HDL Cholesterol (Calc): 105 mg/dL (calc) (ref <130)
Total CHOL/HDL Ratio: 3 (calc) (ref <5.0)
Triglycerides: 84 mg/dL (ref <150)

## 2022-03-26 LAB — HEMOGLOBIN A1C
Hgb A1c MFr Bld: 5 % of total Hgb (ref <5.7)
Mean Plasma Glucose: 97 mg/dL
eAG (mmol/L): 5.4 mmol/L

## 2022-03-26 LAB — VITAMIN D 25 HYDROXY (VIT D DEFICIENCY, FRACTURES): Vit D, 25-Hydroxy: 30 ng/mL (ref 30–100)

## 2022-04-08 ENCOUNTER — Encounter: Payer: Self-pay | Admitting: Internal Medicine

## 2022-04-22 ENCOUNTER — Encounter: Payer: Self-pay | Admitting: Internal Medicine

## 2022-05-26 ENCOUNTER — Other Ambulatory Visit: Payer: Self-pay | Admitting: Internal Medicine

## 2022-05-27 NOTE — Telephone Encounter (Signed)
Requested Prescriptions  Pending Prescriptions Disp Refills   buPROPion (WELLBUTRIN XL) 300 MG 24 hr tablet [Pharmacy Med Name: buPROPion HCl ER (XL) 300 MG Oral Tablet Extended Release 24 Hour] 90 tablet 0    Sig: Take 1 tablet by mouth once daily     Psychiatry: Antidepressants - bupropion Failed - 05/26/2022  6:55 PM      Failed - Cr in normal range and within 360 days    Creat  Date Value Ref Range Status  03/25/2022 1.00 (H) 0.50 - 0.96 mg/dL Final         Passed - AST in normal range and within 360 days    AST  Date Value Ref Range Status  03/25/2022 15 10 - 30 U/L Final   SGOT(AST)  Date Value Ref Range Status  07/06/2014 28 15 - 37 Unit/L Final         Passed - ALT in normal range and within 360 days    ALT  Date Value Ref Range Status  03/25/2022 20 6 - 29 U/L Final   SGPT (ALT)  Date Value Ref Range Status  07/06/2014 29 U/L Final    Comment:    14-63 NOTE: New Reference Range 02/07/14          Passed - Completed PHQ-2 or PHQ-9 in the last 360 days      Passed - Last BP in normal range    BP Readings from Last 1 Encounters:  03/25/22 104/60         Passed - Valid encounter within last 6 months    Recent Outpatient Visits           2 months ago Encounter for general adult medical examination with abnormal findings   [facility] [place], Coralie Keens, NP   5 months ago Anxiety and depression   [place], Coralie Keens, NP   8 months ago Anxiety and depression   [facility] [place], Coralie Keens, NP   1 year ago Screening for cervical cancer   [facility] [place], Coralie Keens, NP   1 year ago Anxiety and depression   [facility] [place], Coralie Keens, NP

## 2022-07-08 ENCOUNTER — Other Ambulatory Visit: Payer: Self-pay | Admitting: Internal Medicine

## 2022-07-09 NOTE — Telephone Encounter (Signed)
Requested Prescriptions  Pending Prescriptions Disp Refills   labetalol (NORMODYNE) 100 MG tablet [Pharmacy Med Name: Labetalol HCl 100 MG Oral Tablet] 90 tablet 0    Sig: Take 1/2 (one-half) tablet by mouth twice daily     Cardiovascular:  Beta Blockers Passed - 07/08/2022  9:23 PM      Passed - Last BP in normal range    BP Readings from Last 1 Encounters:  03/25/22 104/60         Passed - Last Heart Rate in normal range    Pulse Readings from Last 1 Encounters:  03/25/22 94         Passed - Valid encounter within last 6 months    Recent Outpatient Visits           3 months ago Encounter for general adult medical examination with abnormal findings   [facility] [place], Salvadore Oxford, NP   7 months ago Anxiety and depression   [facility] [place], Salvadore Oxford, NP   9 months ago Anxiety and depression   [facility] [place], Salvadore Oxford, NP   1 year ago Screening for cervical cancer   [facility] [place], Salvadore Oxford, NP   1 year ago Anxiety and depression   [facility] [place], Salvadore Oxford, NP

## 2022-07-16 ENCOUNTER — Other Ambulatory Visit: Payer: Self-pay

## 2022-07-25 ENCOUNTER — Ambulatory Visit: Payer: Self-pay | Admitting: Internal Medicine

## 2022-08-20 ENCOUNTER — Other Ambulatory Visit: Payer: Self-pay | Admitting: Internal Medicine

## 2022-08-20 NOTE — Telephone Encounter (Signed)
Requested Prescriptions  Pending Prescriptions Disp Refills   buPROPion (WELLBUTRIN XL) 300 MG 24 hr tablet [Pharmacy Med Name: buPROPion HCl ER (XL) 300 MG Oral Tablet Extended Release 24 Hour] 90 tablet 0    Sig: Take 1 tablet by mouth once daily     Psychiatry: Antidepressants - bupropion Failed - 08/20/2022  1:00 PM      Failed - Cr in normal range and within 360 days    Creat  Date Value Ref Range Status  03/25/2022 1.00 (H) 0.50 - 0.96 mg/dL Final         Passed - AST in normal range and within 360 days    AST  Date Value Ref Range Status  03/25/2022 15 10 - 30 U/L Final   SGOT(AST)  Date Value Ref Range Status  07/06/2014 28 15 - 37 Unit/L Final         Passed - ALT in normal range and within 360 days    ALT  Date Value Ref Range Status  03/25/2022 20 6 - 29 U/L Final   SGPT (ALT)  Date Value Ref Range Status  07/06/2014 29 U/L Final    Comment:    14-63 NOTE: New Reference Range 02/07/14          Passed - Completed PHQ-2 or PHQ-9 in the last 360 days      Passed - Last BP in normal range    BP Readings from Last 1 Encounters:  03/25/22 104/60         Passed - Valid encounter within last 6 months    Recent Outpatient Visits           4 months ago Encounter for general adult medical examination with abnormal findings   [facility] [place], Coralie Keens, NP   8 months ago Anxiety and depression   [facility] [place], Coralie Keens, NP   11 months ago Anxiety and depression   [facility] [place], Coralie Keens, NP   1 year ago Screening for cervical cancer   [facility] [place], Coralie Keens, NP   1 year ago Anxiety and depression   [facility] [place], Coralie Keens, [place]

## 2022-10-05 ENCOUNTER — Other Ambulatory Visit: Payer: Self-pay | Admitting: Internal Medicine

## 2022-10-07 NOTE — Telephone Encounter (Signed)
Courtesy refill. Patient will need an office visit for further refills. Requested Prescriptions  Pending Prescriptions Disp Refills   labetalol (NORMODYNE) 100 MG tablet [Pharmacy Med Name: Labetalol HCl 100 MG Oral Tablet] 90 tablet 0    Sig: Take 1/2 (one-half) tablet by mouth twice daily     Cardiovascular:  Beta Blockers Failed - 10/05/2022 11:25 AM      Failed - Valid encounter within last 6 months    Recent Outpatient Visits           6 months ago Encounter for general adult medical examination with abnormal findings   [facility] [place], Coralie Keens, NP   10 months ago Anxiety and depression   [facility] [place], Coralie Keens, NP   1 year ago Anxiety and depression   [facility] [place], Coralie Keens, NP   1 year ago Screening for cervical cancer   [facility] [place], Coralie Keens, NP   1 year ago Anxiety and depression   [facility] [place], [place] W, NP              Passed - Last BP in normal range    BP Readings from Last 1 Encounters:  03/25/22 104/60         Passed - Last Heart Rate in normal range    Pulse Readings from Last 1 Encounters:  03/25/22 94

## 2022-10-21 ENCOUNTER — Other Ambulatory Visit: Payer: Self-pay | Admitting: Internal Medicine

## 2022-10-21 NOTE — Telephone Encounter (Signed)
Requested medication (s) are due for refill today: yes  Requested medication (s) are on the active medication list: yes  Last refill:  08/20/22 #60 0 refills  Future visit scheduled: no  Notes to clinic:  do you want to give another courtesy refill? Called patient to schedule appt for med refills. No answer LVMTCB     Requested Prescriptions  Pending Prescriptions Disp Refills   buPROPion (WELLBUTRIN XL) 300 MG 24 hr tablet [Pharmacy Med Name: buPROPion HCl ER (XL) 300 MG Oral Tablet Extended Release 24 Hour] 60 tablet 0    Sig: Take 1 tablet by mouth once daily     Psychiatry: Antidepressants - bupropion Failed - 10/21/2022  1:06 PM      Failed - Cr in normal range and within 360 days    Creat  Date Value Ref Range Status  03/25/2022 1.00 (H) 0.50 - 0.96 mg/dL Final         Failed - Valid encounter within last 6 months    Recent Outpatient Visits           7 months ago Encounter for general adult medical examination with abnormal findings   [facility] [place], Coralie Keens, NP   10 months ago Anxiety and depression   [facility] [place], Coralie Keens, NP   1 year ago Anxiety and depression   [facility] [place], Coralie Keens, NP   1 year ago Screening for cervical cancer   [facility] [place], Coralie Keens, NP   1 year ago Anxiety and depression   [facility] [place], [place] W, NP              Passed - AST in normal range and within 360 days    AST  Date Value Ref Range Status  03/25/2022 15 10 - 30 U/L Final   SGOT(AST)  Date Value Ref Range Status  07/06/2014 28 15 - 37 Unit/L Final         Passed - ALT in normal range and within 360 days    ALT  Date Value Ref Range Status  03/25/2022 20 6 - 29 U/L Final   SGPT (ALT)  Date Value Ref Range Status  07/06/2014 29 U/L Final    Comment:    14-63 NOTE: New Reference Range 02/07/14           Passed - Completed PHQ-2 or PHQ-9 in the last 360 days      Passed - Last BP in normal range    BP Readings from Last 1 Encounters:  03/25/22 104/60

## 2022-10-21 NOTE — Telephone Encounter (Signed)
Called patient to schedule appt for medication refills. No answer, LVMTCB #[phone]. 

## 2022-10-29 ENCOUNTER — Encounter: Payer: Self-pay | Admitting: Internal Medicine

## 2022-10-29 ENCOUNTER — Telehealth (INDEPENDENT_AMBULATORY_CARE_PROVIDER_SITE_OTHER): Payer: Self-pay | Admitting: Internal Medicine

## 2022-10-29 DIAGNOSIS — F419 Anxiety disorder, unspecified: Secondary | ICD-10-CM

## 2022-10-29 DIAGNOSIS — R000 Tachycardia, unspecified: Secondary | ICD-10-CM

## 2022-10-29 DIAGNOSIS — K219 Gastro-esophageal reflux disease without esophagitis: Secondary | ICD-10-CM

## 2022-10-29 DIAGNOSIS — E782 Mixed hyperlipidemia: Secondary | ICD-10-CM

## 2022-10-29 DIAGNOSIS — F32A Depression, unspecified: Secondary | ICD-10-CM

## 2022-10-29 MED ORDER — LABETALOL HCL 100 MG PO TABS: ORAL_TABLET | ORAL | 1 refills | Status: DC

## 2022-10-29 MED ORDER — BUPROPION HCL ER (XL) 300 MG PO TB24: 300.0000 mg | ORAL_TABLET | Freq: Every day | ORAL | 1 refills | Status: DC

## 2022-10-29 NOTE — Assessment & Plan Note (Signed)
Stable per appropriate, refilled today Support offered

## 2022-10-29 NOTE — Assessment & Plan Note (Signed)
Encouraged her to consume a low-fat diet 

## 2022-10-29 NOTE — Progress Notes (Signed)
Virtual Visit via Video Note  I connected with Sydney Clark on 10/29/22 at 11:20 AM EDT by a video enabled telemedicine application and verified that I am speaking with the correct person using two identifiers.  Location: Patient: Home Provider: Office  Persons participating in this video call: Nicki Reaperegina Shamyia [place]pre, [place]   I discussed the limitations of evaluation and management by telemedicine and the availability of in person appointments. The patient expressed understanding and agreed to proceed.  History of Present Illness:  Patient due for follow-up of chronic conditions.  GERD: Currently an issue. She is not currently taking Omeprazole. There is no upper GI on file.  Tachycardia: Managed with Labetalol.  ECG from 04/2018 reviewed.  HLD: Her last LDL was 88, triglycerides 84, 03/2022.  She is not taking any cholesterol-lowering medication at this time.  She does not consume low-fat diet.  Anxiety and Depression: Chronic, managed on Bupropion.  She is not currently seeing a therapist.  She denies SI/HI.    Past Medical History:  Diagnosis Date   Anxiety    Depression     Current Outpatient Medications  Medication Sig Dispense Refill   albuterol (VENTOLIN HFA) 108 (90 Base) MCG/ACT inhaler TAKE 2 PUFFS BY MOUTH INTO THE LUNGS EVERY 6 HOURS AS NEEDED 8.5 each 1   buPROPion (WELLBUTRIN XL) 300 MG 24 hr tablet Take 1 tablet by mouth once daily 60 tablet 0   busPIRone (BUSPAR) 10 MG tablet Take 2 tablets (20 mg total) by mouth 3 (three) times daily. 540 tablet 0   dimenhyDRINATE (DRAMAMINE) 50 MG tablet Take 50 mg by mouth daily as needed.     labetalol (NORMODYNE) 100 MG tablet Take 1/2 (one-half) tablet by mouth twice daily 90 tablet 0   omeprazole (PRILOSEC) 20 MG capsule Take 1 capsule (20 mg total) by mouth daily. (Patient not taking: Reported on 03/25/2022) 90 capsule 1   ondansetron (ZOFRAN-ODT) 4 MG disintegrating tablet Take 1 tablet (4 mg total) by mouth every 8  (eight) hours as needed for nausea or vomiting. 30 tablet 0   No current facility-administered medications for this visit.    No Known Allergies  Family History  Problem Relation Age of Onset   Cirrhosis Mother    Alcohol abuse Mother    Depression Mother    Alcohol abuse Father    Anxiety disorder Sister    Cancer Neg Hx    Heart attack Neg Hx    Stroke Neg Hx     Social History   Socioeconomic History   Marital status: Married    Spouse name: Not on file   Number of children: 0   Years of education: Not on file   Highest education level: 10th grade  Occupational History   Not on file  Tobacco Use   Smoking status: Former    Packs/day: 0.50    Years: 5.00    Additional pack years: 0.00    Total pack years: 2.50    Types: Cigarettes    Quit date: 04/28/2018    Years since quitting: 4.5   Smokeless tobacco: Former  Building services engineerVaping Use   Vaping Use: Former   Substances: Nicotine, Flavoring  Substance and Sexual Activity   Alcohol use: No   Drug use: Yes    Types: Marijuana    Comment: last smoked this am   Sexual activity: Yes    Birth control/protection: None  Other Topics Concern   Not on file  Social History Narrative  Not on file   Social Determinants of Health   Financial Resource Strain: Medium Risk (09/21/2018)   Overall Financial Resource Strain (CARDIA)    Difficulty of Paying Living Expenses: Somewhat hard  Food Insecurity: Food Insecurity Present (09/21/2018)   Hunger Vital Sign    Worried About Running Out of Food in the Last Year: Sometimes true    Ran Out of Food in the Last Year: Sometimes true  Transportation Needs: Unmet Transportation Needs (09/21/2018)   PRAPARE - Administrator, Civil Service (Medical): Yes    Lack of Transportation (Non-Medical): Yes  Physical Activity: Inactive (09/21/2018)   Exercise Vital Sign    Days of Exercise per Week: 0 days    Minutes of Exercise per Session: 0 min  Stress: Not on file  Social Connections:  Not on file  Intimate Partner Violence: Not At Risk (09/21/2018)   Humiliation, Afraid, Rape, and Kick questionnaire    Fear of Current or Ex-Partner: No    Emotionally Abused: No    Physically Abused: No    Sexually Abused: No     Constitutional: Denies fever, malaise, fatigue, headache or abrupt weight changes.  HEENT: Denies eye pain, eye redness, ear pain, ringing in the ears, wax buildup, runny nose, nasal congestion, bloody nose, or sore throat. Respiratory: Denies difficulty breathing, shortness of breath, cough or sputum production.   Cardiovascular: Denies chest pain, chest tightness, palpitations or swelling in the hands or feet.  Gastrointestinal: Denies abdominal pain, bloating, constipation, diarrhea or blood in the stool.  GU: Denies urgency, frequency, pain with urination, burning sensation, blood in urine, odor or discharge. Musculoskeletal: Denies decrease in range of motion, difficulty with gait, muscle pain or joint pain and swelling.  Skin: Denies redness, rashes, lesions or ulcercations.  Neurological: Denies dizziness, difficulty with memory, difficulty with speech or problems with balance and coordination.  Psych: Patient has a history of anxiety and depression.  Denies SI/HI.  No other specific complaints in a complete review of systems (except as listed in HPI above).  Observations/Objective:  There were no vitals taken for this visit. Wt Readings from Last 3 Encounters:  03/25/22 147 lb (66.7 kg)  07/31/21 180 lb (81.6 kg)  04/30/21 179 lb (81.2 kg)    General: Appears her stated age, well developed, well nourished in NAD.  HEENT: Head: normal shape and size;  Pulmonary/Chest: Normal effort. No respiratory distress.  Neurological: Alert and oriented. Coordination normal.  Psychiatric: Mood and affect normal. Behavior is normal. Judgment and thought content normal.     BMET    Component Value Date/Time   NA 141 03/25/2022 1015   NA 140 07/06/2014  1143   K 4.7 03/25/2022 1015   K 3.9 07/06/2014 1143   CL 105 03/25/2022 1015   CL 107 07/06/2014 1143   CO2 25 03/25/2022 1015   CO2 23 07/06/2014 1143   GLUCOSE 97 03/25/2022 1015   GLUCOSE 102 (H) 07/06/2014 1143   BUN 16 03/25/2022 1015   BUN 9 07/06/2014 1143   CREATININE 1.00 (H) 03/25/2022 1015   CALCIUM 10.0 03/25/2022 1015   CALCIUM 9.5 07/06/2014 1143   GFRNONAA 87 01/24/2020 1055   GFRAA 100 01/24/2020 1055    Lipid Panel     Component Value Date/Time   CHOL 158 03/25/2022 1015   TRIG 84 03/25/2022 1015   HDL 53 03/25/2022 1015   CHOLHDL 3.0 03/25/2022 1015   LDLCALC 88 03/25/2022 1015    CBC  Component Value Date/Time   WBC 4.9 03/25/2022 1015   RBC 4.31 03/25/2022 1015   HGB 14.0 03/25/2022 1015   HGB 15.3 07/06/2014 1143   HCT 41.0 03/25/2022 1015   HCT 45.0 07/06/2014 1143   PLT 240 03/25/2022 1015   PLT 236 07/06/2014 1143   MCV 95.1 03/25/2022 1015   MCV 96 07/06/2014 1143   MCH 32.5 03/25/2022 1015   MCHC 34.1 03/25/2022 1015   RDW 11.3 03/25/2022 1015   RDW 12.4 07/06/2014 1143   LYMPHSABS 2,330 01/24/2020 1055   EOSABS 211 01/24/2020 1055   BASOSABS 32 01/24/2020 1055    Hgb A1C Lab Results  Component Value Date   HGBA1C 5.0 03/25/2022       Assessment and Plan:  RTC in 6 months for annual exam Nicki Reaper, NP   Follow Up Instructions:    I discussed the assessment and treatment plan with the patient. The patient was provided an opportunity to ask questions and all were answered. The patient agreed with the plan and demonstrated an understanding of the instructions.   The patient was advised to call back or seek an in-person evaluation if the symptoms worsen or if the condition fails to improve as anticipated.   Nicki Reaper, NP

## 2022-10-29 NOTE — Assessment & Plan Note (Signed)
-  Continue labetalol 

## 2022-10-29 NOTE — Assessment & Plan Note (Signed)
Currently not an issue off meds °

## 2022-10-29 NOTE — Patient Instructions (Signed)

## 2022-11-14 ENCOUNTER — Encounter: Payer: Self-pay | Admitting: Internal Medicine

## 2023-01-12 ENCOUNTER — Encounter: Payer: Self-pay | Admitting: Internal Medicine

## 2023-03-03 ENCOUNTER — Encounter: Payer: Self-pay | Admitting: Internal Medicine

## 2023-03-06 ENCOUNTER — Ambulatory Visit (INDEPENDENT_AMBULATORY_CARE_PROVIDER_SITE_OTHER): Payer: Self-pay | Admitting: Internal Medicine

## 2023-03-06 ENCOUNTER — Encounter: Payer: Self-pay | Admitting: Internal Medicine

## 2023-03-06 VITALS — BP 126/84 | HR 119 | Temp 96.6°F | Wt 148.0 lb

## 2023-03-06 DIAGNOSIS — N644 Mastodynia: Secondary | ICD-10-CM

## 2023-03-06 NOTE — Progress Notes (Signed)
Subjective:    Patient ID: Sydney Clark, female    DOB: Sep 22, 1995, 27 y.o.   MRN: [id number]  HPI  Patient presents to clinic today with complaint of a lump in her left breast.  She noticed this 4 days ago. She reports the lump was slightly tender but she has been unable to feel the lump at this time. She has not noticed any changes in the skin or discharge from the nipple. She has no close family history of breast cancer.  Review of Systems     Past Medical History:  Diagnosis Date   Anxiety    Depression     Current Outpatient Medications  Medication Sig Dispense Refill   albuterol (VENTOLIN HFA) 108 (90 Base) MCG/ACT inhaler TAKE 2 PUFFS BY MOUTH INTO THE LUNGS EVERY 6 HOURS AS NEEDED 8.5 each 1   buPROPion (WELLBUTRIN XL) 300 MG 24 hr tablet Take 1 tablet (300 mg total) by mouth daily. 90 tablet 1   dimenhyDRINATE (DRAMAMINE) 50 MG tablet Take 50 mg by mouth daily as needed.     labetalol (NORMODYNE) 100 MG tablet Take 1/2 (one-half) tablet by mouth twice daily 180 tablet 1   ondansetron (ZOFRAN-ODT) 4 MG disintegrating tablet Take 1 tablet (4 mg total) by mouth every 8 (eight) hours as needed for nausea or vomiting. 30 tablet 0   No current facility-administered medications for this visit.    No Known Allergies  Family History  Problem Relation Age of Onset   Cirrhosis Mother    Alcohol abuse Mother    Depression Mother    Alcohol abuse Father    Anxiety disorder Sister    Cancer Neg Hx    Heart attack Neg Hx    Stroke Neg Hx     Social History   Socioeconomic History   Marital status: Married    Spouse name: Not on file   Number of children: 0   Years of education: Not on file   Highest education level: 10th grade  Occupational History   Not on file  Tobacco Use   Smoking status: Former    Current packs/day: 0.00    Average packs/day: 0.5 packs/day for 5.0 years (2.5 ttl pk-yrs)    Types: Cigarettes    Start date: 04/28/2013    Quit date: 04/28/2018     Years since quitting: 4.8   Smokeless tobacco: Former  Building services engineer status: Former   Substances: Nicotine, Flavoring  Substance and Sexual Activity   Alcohol use: No   Drug use: Yes    Types: Marijuana    Comment: last smoked this am   Sexual activity: Yes    Birth control/protection: None  Other Topics Concern   Not on file  Social History Narrative   Not on file   Social Determinants of Health   Financial Resource Strain: Medium Risk (09/21/2018)   Overall Financial Resource Strain (CARDIA)    Difficulty of Paying Living Expenses: Somewhat hard  Food Insecurity: Food Insecurity Present (09/21/2018)   Hunger Vital Sign    Worried About Running Out of Food in the Last Year: Sometimes true    Ran Out of Food in the Last Year: Sometimes true  Transportation Needs: Unmet Transportation Needs (09/21/2018)   PRAPARE - Transportation    Lack of Transportation (Medical): Yes    Lack of Transportation (Non-Medical): Yes  Physical Activity: Inactive (09/21/2018)   Exercise Vital Sign    Days of Exercise per Week: 0  days    Minutes of Exercise per Session: 0 min  Stress: Not on file  Social Connections: Not on file  Intimate Partner Violence: Not At Risk (09/21/2018)   Humiliation, Afraid, Rape, and Kick questionnaire    Fear of Current or Ex-Partner: No    Emotionally Abused: No    Physically Abused: No    Sexually Abused: No     Constitutional: Denies fever, malaise, fatigue, headache or abrupt weight changes.  HEENT: Denies eye pain, eye redness, ear pain, ringing in the ears, wax buildup, runny nose, nasal congestion, bloody nose, or sore throat. Respiratory: Denies difficulty breathing, shortness of breath, cough or sputum production.   Cardiovascular: Denies chest pain, chest tightness, palpitations or swelling in the hands or feet.  Gastrointestinal: Denies abdominal pain, bloating, constipation, diarrhea or blood in the stool.  GU: Denies urgency, frequency, pain with  urination, burning sensation, blood in urine, odor or discharge. Musculoskeletal: Denies decrease in range of motion, difficulty with gait, muscle pain or joint pain and swelling.  Skin: Patient reports left breast pain.  Denies redness, rashes, lesions or ulcercations.  Neurological: Denies dizziness, difficulty with memory, difficulty with speech or problems with balance and coordination.  Psych: Patient has a history of anxiety and depression.  Denies SI/HI.  No other specific complaints in a complete review of systems (except as listed in HPI above).  Objective:   Physical Exam   BP 126/84 (BP Location: Left Arm, Patient Position: Sitting, Cuff Size: Normal)   Pulse (!) 119   Temp (!) 96.6 F (35.9 C) (Temporal)   Wt 148 lb (67.1 kg)   SpO2 98%   BMI 23.89 kg/m   Wt Readings from Last 3 Encounters:  03/25/22 147 lb (66.7 kg)  07/31/21 180 lb (81.6 kg)  04/30/21 179 lb (81.2 kg)    General: Appears her stated age, well developed, well nourished in NAD. Breast: Symmetrical.  Fibrocystic changes noted bilaterally without discrete mass.  No axillary lymphadenopathy.  Unable to express any discharge from the nipple. Cardiovascular:Tachycardic with normal rhythm. S1,S2 noted.  No murmur, rubs or gallops noted.  Pulmonary/Chest: Normal effort and positive vesicular breath sounds. No respiratory distress. No wheezes, rales or ronchi noted.  Neurological: Alert and oriented. Coordination normal.  Psychiatric: Anxious appearing.    BMET    Component Value Date/Time   NA 141 03/25/2022 1015   NA 140 07/06/2014 1143   K 4.7 03/25/2022 1015   K 3.9 07/06/2014 1143   CL 105 03/25/2022 1015   CL 107 07/06/2014 1143   CO2 25 03/25/2022 1015   CO2 23 07/06/2014 1143   GLUCOSE 97 03/25/2022 1015   GLUCOSE 102 (H) 07/06/2014 1143   BUN 16 03/25/2022 1015   BUN 9 07/06/2014 1143   CREATININE 1.00 (H) 03/25/2022 1015   CALCIUM 10.0 03/25/2022 1015   CALCIUM 9.5 07/06/2014 1143    GFRNONAA 87 01/24/2020 1055   GFRAA 100 01/24/2020 1055    Lipid Panel     Component Value Date/Time   CHOL 158 03/25/2022 1015   TRIG 84 03/25/2022 1015   HDL 53 03/25/2022 1015   CHOLHDL 3.0 03/25/2022 1015   LDLCALC 88 03/25/2022 1015    CBC    Component Value Date/Time   WBC 4.9 03/25/2022 1015   RBC 4.31 03/25/2022 1015   HGB 14.0 03/25/2022 1015   HGB 15.3 07/06/2014 1143   HCT 41.0 03/25/2022 1015   HCT 45.0 07/06/2014 1143   PLT 240 03/25/2022  1015   PLT 236 07/06/2014 1143   MCV 95.1 03/25/2022 1015   MCV 96 07/06/2014 1143   MCH 32.5 03/25/2022 1015   MCHC 34.1 03/25/2022 1015   RDW 11.3 03/25/2022 1015   RDW 12.4 07/06/2014 1143   LYMPHSABS 2,330 01/24/2020 1055   EOSABS 211 01/24/2020 1055   BASOSABS 32 01/24/2020 1055    Hgb A1C Lab Results  Component Value Date   HGBA1C 5.0 03/25/2022           Assessment & Plan:   Left breast pain:  No discrete mass noted on exam We will obtain ultrasound left breast including axilla for further evaluation for reassurance  RTC in 1 month for your annual exam Nicki Reaper, NP

## 2023-03-06 NOTE — Patient Instructions (Signed)
Breast Tenderness Breast tenderness is a common problem for women of all ages, but may also occur in men. Breast tenderness has many possible causes, including hormone changes, infections, taking certain medicines, and caffeine intake. In women, the pain usually comes and goes with the menstrual cycle, but it can also be constant. Breast tenderness may range from mild discomfort to severe pain. You may have tests, such as a mammogram or an ultrasound, to check for any unusual findings. Having breast tenderness usually does not mean that you have breast cancer. Follow these instructions at home: Managing pain and discomfort  If directed, put ice on the painful area. To do this: Put ice in a plastic bag. Place a towel between your skin and the bag. Leave the ice on for 20 minutes, 2-3 times a day. If your skin turns bright red, remove the ice right away to prevent skin damage. The risk of skin damage is higher if you cannot feel pain, heat, or cold. Wear a supportive bra or chest support: During exercise. While sleeping, if your breasts are very tender. Medicines Take over-the-counter and prescription medicines only as told by your health care provider. If the cause of your pain is an infection, you may be prescribed an antibiotic medicine. If you were prescribed antibiotics, take them as told by your health care provider. Do not stop using the antibiotic even if you start to feel better. Eating and drinking Decrease the amount of caffeine in your diet. Instead, drink more water and choose caffeine-free drinks. Your health care provider may recommend that you lessen the amount of fat in your diet. You can do this by: Limiting fried foods. Cooking foods using methods such as baking, boiling, grilling, and broiling. General instructions  Keep a log of the days and times when your breasts are most tender. Ask your health care provider how to do breast exams at home. This will help you notice if  you have an unusual growth or lump. Keep all follow-up visits. Contact a health care provider if: Any part of your breast is hard, red, and hot to the touch. This may be a sign of infection. You are a woman and have a new or painful lump in your breast that remains after your menstrual period ends. You are not breastfeeding and you have fluid, especially blood or pus, coming out of your nipples. You have a fever. Your pain does not improve or it gets worse. Your pain is interfering with your daily activities. Summary Breast tenderness may range from mild discomfort to severe pain. Breast tenderness has many possible causes, including hormone changes, infections, taking certain medicines, and caffeine intake. It can be treated with ice, wearing a supportive bra or chest support, and medicines. Make changes to your diet as told by your health care provider. This information is not intended to replace advice given to you by your health care provider. Make sure you discuss any questions you have with your health care provider. Document Revised: 09/18/2021 Document Reviewed: 09/18/2021 Elsevier Patient Education  2024 [employer].

## 2023-03-16 ENCOUNTER — Ambulatory Visit: Payer: Self-pay | Admitting: Internal Medicine

## 2023-03-17 ENCOUNTER — Ambulatory Visit
Admission: RE | Admit: 2023-03-17 | Discharge: 2023-03-17 | Disposition: A | Discharge status: Home / Self Care | Payer: Self-pay | Source: Ambulatory Visit | Attending: Internal Medicine | Admitting: Internal Medicine

## 2023-03-17 DIAGNOSIS — N644 Mastodynia: Secondary | ICD-10-CM | POA: Insufficient documentation

## 2023-03-29 ENCOUNTER — Encounter: Payer: Self-pay | Admitting: Internal Medicine

## 2023-03-31 ENCOUNTER — Encounter: Payer: Self-pay | Admitting: Internal Medicine

## 2023-03-31 ENCOUNTER — Ambulatory Visit (INDEPENDENT_AMBULATORY_CARE_PROVIDER_SITE_OTHER): Payer: Self-pay | Admitting: Internal Medicine

## 2023-03-31 VITALS — BP 112/76 | HR 107 | Temp 96.8°F | Wt 148.0 lb

## 2023-03-31 DIAGNOSIS — E782 Mixed hyperlipidemia: Secondary | ICD-10-CM

## 2023-03-31 DIAGNOSIS — Z0001 Encounter for general adult medical examination with abnormal findings: Secondary | ICD-10-CM

## 2023-03-31 DIAGNOSIS — L308 Other specified dermatitis: Secondary | ICD-10-CM

## 2023-03-31 DIAGNOSIS — Z114 Encounter for screening for human immunodeficiency virus [HIV]: Secondary | ICD-10-CM

## 2023-03-31 DIAGNOSIS — L309 Dermatitis, unspecified: Secondary | ICD-10-CM | POA: Insufficient documentation

## 2023-03-31 DIAGNOSIS — R7309 Other abnormal glucose: Secondary | ICD-10-CM

## 2023-03-31 DIAGNOSIS — Z1159 Encounter for screening for other viral diseases: Secondary | ICD-10-CM

## 2023-03-31 MED ORDER — CLOBETASOL PROPIONATE 0.05 % EX CREA: 1.0000 | TOPICAL_CREAM | Freq: Two times a day (BID) | CUTANEOUS | 0 refills | Status: AC

## 2023-03-31 NOTE — Progress Notes (Signed)
Subjective:    Patient ID: Sydney Clark, female    DOB: [birth year], 27 y.o.   MRN: [id number]  HPI  Patient presents to clinic today for her annual exam.  Flu: never Tetanus: > 10 years ago COVID: never Pap smear: 04/2021 Dentist: annually  Diet: She does eat meat. She consumes fruits and veggies. She tries to avoid fried foods. She drinks mostly sparkling flavored water Exercise: walking dog   Review of Systems     Past Medical History:  Diagnosis Date   Anxiety    Depression     Current Outpatient Medications  Medication Sig Dispense Refill   albuterol (VENTOLIN HFA) 108 (90 Base) MCG/ACT inhaler TAKE 2 PUFFS BY MOUTH INTO THE LUNGS EVERY 6 HOURS AS NEEDED 8.5 each 1   buPROPion (WELLBUTRIN XL) 300 MG 24 hr tablet Take 1 tablet (300 mg total) by mouth daily. 90 tablet 1   dimenhyDRINATE (DRAMAMINE) 50 MG tablet Take 50 mg by mouth daily as needed.     labetalol (NORMODYNE) 100 MG tablet Take 1/2 (one-half) tablet by mouth twice daily (Patient not taking: Reported on 03/06/2023) 180 tablet 1   ondansetron (ZOFRAN-ODT) 4 MG disintegrating tablet Take 1 tablet (4 mg total) by mouth every 8 (eight) hours as needed for nausea or vomiting. 30 tablet 0   No current facility-administered medications for this visit.    No Known Allergies  Family History  Problem Relation Age of Onset   Cirrhosis Mother    Alcohol abuse Mother    Depression Mother    Alcohol abuse Father    Anxiety disorder Sister    Cancer Neg Hx    Heart attack Neg Hx    Stroke Neg Hx     Social History   Socioeconomic History   Marital status: Married    Spouse name: Not on file   Number of children: 0   Years of education: Not on file   Highest education level: 10th grade  Occupational History   Not on file  Tobacco Use   Smoking status: Former    Current packs/day: 0.00    Average packs/day: 0.5 packs/day for 5.0 years (2.5 ttl pk-yrs)    Types: Cigarettes    Start date: 04/28/2013     Quit date: 04/28/2018    Years since quitting: 4.9   Smokeless tobacco: Former  Building services engineer status: Former   Substances: Nicotine, Flavoring  Substance and Sexual Activity   Alcohol use: No   Drug use: Yes    Types: Marijuana    Comment: last smoked this am   Sexual activity: Yes    Birth control/protection: None  Other Topics Concern   Not on file  Social History Narrative   Not on file   Social Determinants of Health   Financial Resource Strain: Medium Risk (09/21/2018)   Overall Financial Resource Strain (CARDIA)    Difficulty of Paying Living Expenses: Somewhat hard  Food Insecurity: Food Insecurity Present (09/21/2018)   Hunger Vital Sign    Worried About Running Out of Food in the Last Year: Sometimes true    Ran Out of Food in the Last Year: Sometimes true  Transportation Needs: Unmet Transportation Needs (09/21/2018)   PRAPARE - Transportation    Lack of Transportation (Medical): Yes    Lack of Transportation (Non-Medical): Yes  Physical Activity: Inactive (09/21/2018)   Exercise Vital Sign    Days of Exercise per Week: 0 days    Minutes of  Exercise per Session: 0 min  Stress: Not on file  Social Connections: Not on file  Intimate Partner Violence: Not At Risk (09/21/2018)   Humiliation, Afraid, Rape, and Kick questionnaire    Fear of Current or Ex-Partner: No    Emotionally Abused: No    Physically Abused: No    Sexually Abused: No     Constitutional: Denies fever, malaise, fatigue, headache or abrupt weight changes.  HEENT: Denies eye pain, eye redness, ear pain, ringing in the ears, wax buildup, runny nose, nasal congestion, bloody nose, or sore throat. Respiratory: Denies difficulty breathing, shortness of breath, cough or sputum production.   Cardiovascular: Denies chest pain, chest tightness, palpitations or swelling in the hands or feet.  Gastrointestinal: Denies abdominal pain, bloating, constipation, diarrhea or blood in the stool.  GU: Denies  urgency, frequency, pain with urination, burning sensation, blood in urine, odor or discharge. Musculoskeletal: Denies decrease in range of motion, difficulty with gait, muscle pain or joint pain and swelling.  Skin: Patient reports rash to left foot.  Denies redness, lesions or ulcercations.  Neurological: Denies dizziness, difficulty with memory, difficulty with speech or problems with balance and coordination.  Psych: Patient has a history of anxiety and depression.  Denies SI/HI.  No other specific complaints in a complete review of systems (except as listed in HPI above).  Objective:   Physical Exam  BP 112/76 (BP Location: Right Arm, Patient Position: Sitting, Cuff Size: Normal)   Pulse (!) 107   Temp (!) 96.8 F (36 C) (Temporal)   Wt 148 lb (67.1 kg)   SpO2 100%   BMI 23.89 kg/m   Wt Readings from Last 3 Encounters:  03/06/23 148 lb (67.1 kg)  03/25/22 147 lb (66.7 kg)  07/31/21 180 lb (81.6 kg)    General: Appears her stated age, well developed, well nourished in NAD. Skin: Warm, dry and intact.  Dry scaly patches noted on top of left foot. Breast: Symmetrical.  Fibrocystic changes noted within the left breast.  No discrete mass identified.  No axillary lymphadenopathy noted. HEENT: Head: normal shape and size; Eyes: sclera white, no icterus, conjunctiva pink, PERRLA and EOMs intact; Neck:  Neck supple, trachea midline. No masses, lumps or thyromegaly present.  Cardiovascular: Tachycardic with normal rhythm. S1,S2 noted.  No murmur, rubs or gallops noted. No JVD or BLE edema.  Pulmonary/Chest: Normal effort and positive vesicular breath sounds. No respiratory distress. No wheezes, rales or ronchi noted.  Abdomen: Soft and nontender. Normal bowel sounds.  Musculoskeletal: Strength 5/5 BUE/BLE.  No difficulty with gait.  Neurological: Alert and oriented. Cranial nerves II-XII grossly intact. Coordination normal.  Psychiatric: Mood and affect normal. Behavior is normal.  Judgment and thought content normal.     BMET    Component Value Date/Time   NA 141 03/25/2022 1015   NA 140 07/06/2014 1143   K 4.7 03/25/2022 1015   K 3.9 07/06/2014 1143   CL 105 03/25/2022 1015   CL 107 07/06/2014 1143   CO2 25 03/25/2022 1015   CO2 23 07/06/2014 1143   GLUCOSE 97 03/25/2022 1015   GLUCOSE 102 (H) 07/06/2014 1143   BUN 16 03/25/2022 1015   BUN 9 07/06/2014 1143   CREATININE 1.00 (H) 03/25/2022 1015   CALCIUM 10.0 03/25/2022 1015   CALCIUM 9.5 07/06/2014 1143   GFRNONAA 87 01/24/2020 1055   GFRAA 100 01/24/2020 1055    Lipid Panel     Component Value Date/Time   CHOL 158 03/25/2022 1015  TRIG 84 03/25/2022 1015   HDL 53 03/25/2022 1015   CHOLHDL 3.0 03/25/2022 1015   LDLCALC 88 03/25/2022 1015    CBC    Component Value Date/Time   WBC 4.9 03/25/2022 1015   RBC 4.31 03/25/2022 1015   HGB 14.0 03/25/2022 1015   HGB 15.3 07/06/2014 1143   HCT 41.0 03/25/2022 1015   HCT 45.0 07/06/2014 1143   PLT 240 03/25/2022 1015   PLT 236 07/06/2014 1143   MCV 95.1 03/25/2022 1015   MCV 96 07/06/2014 1143   MCH 32.5 03/25/2022 1015   MCHC 34.1 03/25/2022 1015   RDW 11.3 03/25/2022 1015   RDW 12.4 07/06/2014 1143   LYMPHSABS 2,330 01/24/2020 1055   EOSABS 211 01/24/2020 1055   BASOSABS 32 01/24/2020 1055    Hgb A1C Lab Results  Component Value Date   HGBA1C 5.0 03/25/2022           Assessment & Plan:   Preventative health maintenance:  Flu declined Tetanus declined Encouraged her to get her COVID-vaccine Pap smear UTD Encouraged her to consume a balanced diet and exercise regimen Advised her to see an eye doctor and dentist annually We will check CBC, c-Met, lipid, A1c, HIV and hep C today  RTC in 6 months, follow-up chronic conditions Nicki Reaper, NP

## 2023-03-31 NOTE — Assessment & Plan Note (Signed)
Rx for clobetasol cream twice daily.

## 2023-03-31 NOTE — Patient Instructions (Signed)

## 2023-04-25 ENCOUNTER — Other Ambulatory Visit: Payer: Self-pay | Admitting: Internal Medicine

## 2023-04-27 NOTE — Telephone Encounter (Signed)
Requested medications are due for refill today.  yes  Requested medications are on the active medications list.  yes  Last refill. 10/29/2022 #90 1 rf  Future visit scheduled.   yes  Notes to clinic.  Labs are expired.    Requested Prescriptions  Pending Prescriptions Disp Refills   buPROPion (WELLBUTRIN XL) 300 MG 24 hr tablet [Pharmacy Med Name: buPROPion HCl ER (XL) 300 MG Oral Tablet Extended Release 24 Hour] 90 tablet 0    Sig: Take 1 tablet by mouth once daily     Psychiatry: Antidepressants - bupropion Failed - 04/25/2023  9:22 AM      Failed - Cr in normal range and within 360 days    Creat  Date Value Ref Range Status  03/25/2022 1.00 (H) 0.50 - 0.96 mg/dL Final         Failed - AST in normal range and within 360 days    AST  Date Value Ref Range Status  03/25/2022 15 10 - 30 U/L Final   SGOT(AST)  Date Value Ref Range Status  07/06/2014 28 15 - 37 Unit/L Final         Failed - ALT in normal range and within 360 days    ALT  Date Value Ref Range Status  03/25/2022 20 6 - 29 U/L Final   SGPT (ALT)  Date Value Ref Range Status  07/06/2014 29 U/L Final    Comment:    14-63 NOTE: New Reference Range 02/07/14          Failed - Completed PHQ-2 or PHQ-9 in the last 360 days      Passed - Last BP in normal range    BP Readings from Last 1 Encounters:  03/31/23 112/76         Passed - Valid encounter within last 6 months    Recent Outpatient Visits           3 weeks ago Encounter for general adult medical examination with abnormal findings   [place] [facility] [place], Salvadore Oxford, NP   1 month ago Breast pain, left   [place] [facility] [place], [place] W, NP   6 months ago Gastroesophageal reflux disease without esophagitis   [place] [facility] [place], Salvadore Oxford, NP   1 year ago Encounter for general adult medical examination with abnormal findings   [place] [facility] [place],  Salvadore Oxford, NP   1 year ago Anxiety and depression   [place] [facility] [place], Salvadore Oxford, NP       Future Appointments             In 5 months Baity, Salvadore Oxford, NP  [facility], [facility]

## 2023-07-30 ENCOUNTER — Encounter: Payer: Self-pay | Admitting: Internal Medicine

## 2023-07-30 MED ORDER — BUPROPION HCL ER (XL) 150 MG PO TB24
150.0000 mg | ORAL_TABLET | Freq: Every day | ORAL | 0 refills | Status: DC
Start: 2023-07-30 — End: 2024-05-09

## 2023-08-25 ENCOUNTER — Other Ambulatory Visit: Payer: Self-pay

## 2023-08-25 ENCOUNTER — Emergency Department
Admission: EM | Admit: 2023-08-25 | Discharge: 2023-08-25 | Disposition: A | Discharge status: Home / Self Care | Payer: Self-pay | Attending: Emergency Medicine | Admitting: Emergency Medicine

## 2023-08-25 DIAGNOSIS — Z23 Encounter for immunization: Secondary | ICD-10-CM | POA: Insufficient documentation

## 2023-08-25 DIAGNOSIS — S0181XA Laceration without foreign body of other part of head, initial encounter: Secondary | ICD-10-CM | POA: Insufficient documentation

## 2023-08-25 DIAGNOSIS — W01198A Fall on same level from slipping, tripping and stumbling with subsequent striking against other object, initial encounter: Secondary | ICD-10-CM | POA: Insufficient documentation

## 2023-08-25 MED ORDER — CEPHALEXIN 500 MG PO CAPS: 500.0000 mg | ORAL_CAPSULE | Freq: Three times a day (TID) | ORAL | 0 refills | Status: AC

## 2023-08-25 MED ORDER — LIDOCAINE-EPINEPHRINE-TETRACAINE (LET) TOPICAL GEL
3.0000 mL | Freq: Once | TOPICAL | Status: AC
  Administered 2023-08-25: 3 mL via TOPICAL
  Filled 2023-08-25: qty 3

## 2023-08-25 MED ORDER — TETANUS-DIPHTH-ACELL PERTUSSIS 5-2.5-18.5 LF-MCG/0.5 IM SUSY
0.5000 mL | PREFILLED_SYRINGE | Freq: Once | INTRAMUSCULAR | Status: AC
  Administered 2023-08-25: 0.5 mL via INTRAMUSCULAR
  Filled 2023-08-25: qty 0.5

## 2023-08-25 NOTE — Discharge Instructions (Signed)
 Your stitches are dissolvable, they should dissolve on their own within 10 days.  If not you can snip right underneath the knot and remove them or see your primary care doctor. Take the antibiotic as prescribed Apply ice to your forehead Once the sutures are removed or disintegrate, you can use sunscreen daily for 1 year along with Mederma or cocoa butter to decrease scarring, Scarring takes about 1 year to appear improved

## 2023-08-25 NOTE — ED Provider Notes (Signed)
 [facility] Provider Note    Event Date/Time   First MD Initiated Contact with Patient 08/25/23 1431     (approximate)   History   Laceration   HPI  Sydney Clark is a 28 y.o. female no significant past medical history presents emergency department laceration to the left side of her forehead.  Patient was taking in the recycling bin when she tripped and fell hitting her head on the edge of the plastic bin.  States a small amount of swelling.  No LOC.  No neck pain.      Physical Exam   Triage Vital Signs: ED Triage Vitals  Encounter Vitals Group     BP 08/25/23 1204 108/70     Systolic BP Percentile --      Diastolic BP Percentile --      Pulse Rate 08/25/23 1204 67     Resp 08/25/23 1204 16     Temp 08/25/23 1204 98 F (36.7 C)     Temp Source 08/25/23 1204 Oral     SpO2 08/25/23 1204 100 %     Weight 08/25/23 1205 145 lb (65.8 kg)     Height 08/25/23 1205 5' 6 (1.676 m)     Head Circumference --      Peak Flow --      Pain Score 08/25/23 1204 6     Pain Loc --      Pain Education --      Exclude from Growth Chart --     Most recent vital signs: Vitals:   08/25/23 1204  BP: 108/70  Pulse: 67  Resp: 16  Temp: 98 F (36.7 C)  SpO2: 100%     General: Awake, no distress.   CV:  Good peripheral perfusion. regular rate and  rhythm Resp:  Normal effort.  Abd:  No distention.   Other:  2 cm laceration noted across the left side of the forehead above the left brow   ED Results / Procedures / Treatments   Labs (all labs ordered are listed, but only abnormal results are displayed) Labs Reviewed - No data to display   EKG     RADIOLOGY     PROCEDURES:   .Laceration Repair  Date/Time: 08/25/2023 4:23 PM  Performed by: Gasper Devere ORN, PA-C Authorized by: Gasper Devere ORN, PA-C   Consent:    Consent obtained:  Verbal   Consent given by:  Patient   Risks, benefits, and alternatives were discussed: yes     Risks  discussed:  Infection, pain, retained foreign body, tendon damage, poor cosmetic result, need for additional repair, nerve damage, poor wound healing and vascular damage   Alternatives discussed:  No treatment Universal protocol:    Procedure explained and questions answered to patient or proxy's satisfaction: yes     Immediately prior to procedure, a time out was called: yes     Patient identity confirmed:  Verbally with patient Anesthesia:    Anesthesia method:  Topical application and local infiltration   Topical anesthetic:  LET   Local anesthetic:  Lidocaine  1% w/o epi Laceration details:    Location:  Face   Face location:  Forehead   Length (cm):  2 Pre-procedure details:    Preparation:  Patient was prepped and draped in usual sterile fashion Exploration:    Limited defect created (wound extended): no     Hemostasis achieved with:  LET   Imaging outcome: foreign body not noted  Wound exploration: entire depth of wound visualized     Wound extent: areolar tissue not violated, fascia not violated, no foreign body, no signs of injury, no nerve damage, no tendon damage, no underlying fracture and no vascular damage     Contaminated: no   Treatment:    Area cleansed with:  Povidone-iodine   Amount of cleaning:  Standard   Irrigation solution:  Sterile saline   Irrigation method:  Tap   Debridement:  None   Undermining:  None   Scar revision: no   Skin repair:    Repair method:  Sutures   Suture size:  5-0   Suture material:  Fast-absorbing gut   Suture technique:  Simple interrupted   Number of sutures:  5 Approximation:    Approximation:  Close Repair type:    Repair type:  Simple Post-procedure details:    Dressing:  Non-adherent dressing   Procedure completion:  Tolerated well, no immediate complications Comments:     Sutures performed by Recardo PA student under my supervision  Chief Complaint  Patient presents with   Laceration      MEDICATIONS ORDERED  IN ED: Medications  Tdap (BOOSTRIX) injection 0.5 mL (0.5 mLs Intramuscular Given 08/25/23 1507)  lidocaine -EPINEPHrine -tetracaine  (LET) topical gel (3 mLs Topical Given 08/25/23 1515)     IMPRESSION / MDM / ASSESSMENT AND PLAN / ED COURSE  I reviewed the triage vital signs and the nursing notes.                              Differential diagnosis includes, but is not limited to, laceration, contusion, abrasion  Patient's presentation is most consistent with acute illness / injury with system symptoms.   See procedure note for laceration repair  Patient's Tdap was updated here in the ED  Suture care instructions were discussed with patient.  Aftercare was discussed with patient.  She is to follow-up with her regular doctor in 7 to 10 days.  Return if worsening.  She is in agreement treatment plan.  Due to it being a trash can that she fell against I did place her on antibiotic.  Keflex  500 3 times daily for 7 days.  Discharged in stable condition.      FINAL CLINICAL IMPRESSION(S) / ED DIAGNOSES   Final diagnoses:  Facial laceration, initial encounter     Rx / DC Orders   ED Discharge Orders          Ordered    cephALEXin  (KEFLEX ) 500 MG capsule  3 times daily        08/25/23 1614             Note:  This document was prepared using Dragon voice recognition software and may include unintentional dictation errors.    Gasper Devere ORN, PA-C 08/25/23 1649    Arlander Charleston, MD 08/31/23 [phone]

## 2023-08-25 NOTE — ED Triage Notes (Signed)
Pt to ED for 2cm laceration to L forehead from tripping and falling on plastic trash bin about 1 hour ago. Bldg controlled. Some swelling noted.

## 2023-08-28 ENCOUNTER — Encounter: Payer: Self-pay | Admitting: Internal Medicine

## 2023-09-28 ENCOUNTER — Ambulatory Visit: Payer: Self-pay | Admitting: Internal Medicine

## 2024-04-29 ENCOUNTER — Encounter: Payer: Self-pay | Admitting: Internal Medicine

## 2024-04-29 ENCOUNTER — Emergency Department
Admission: EM | Admit: 2024-04-29 | Discharge: 2024-04-29 | Disposition: A | Discharge status: Home / Self Care | Payer: Self-pay | Attending: Emergency Medicine | Admitting: Emergency Medicine

## 2024-04-29 ENCOUNTER — Encounter: Payer: Self-pay | Admitting: Emergency Medicine

## 2024-04-29 ENCOUNTER — Other Ambulatory Visit: Payer: Self-pay

## 2024-04-29 ENCOUNTER — Ambulatory Visit
Admission: EM | Admit: 2024-04-29 | Discharge: 2024-04-29 | Disposition: A | Discharge status: Home / Self Care | Payer: Self-pay | Attending: Emergency Medicine | Admitting: Emergency Medicine

## 2024-04-29 DIAGNOSIS — R310 Gross hematuria: Secondary | ICD-10-CM | POA: Insufficient documentation

## 2024-04-29 DIAGNOSIS — R350 Frequency of micturition: Secondary | ICD-10-CM | POA: Insufficient documentation

## 2024-04-29 LAB — URINALYSIS, ROUTINE W REFLEX MICROSCOPIC
Bacteria, UA: NONE SEEN
Bilirubin Urine: NEGATIVE
Glucose, UA: NEGATIVE mg/dL
Ketones, ur: NEGATIVE mg/dL
Leukocytes,Ua: NEGATIVE
Nitrite: NEGATIVE
Protein, ur: NEGATIVE mg/dL
RBC / HPF: >50 RBC/hpf (ref 0–5)
Specific Gravity, Urine: 1.017 (ref 1.005–1.030)
pH: 6 (ref 5.0–8.0)

## 2024-04-29 LAB — POC URINE PREG, ED: Preg Test, Ur: NEGATIVE

## 2024-04-29 LAB — POCT URINE DIPSTICK
Bilirubin, UA: NEGATIVE
Glucose, UA: NEGATIVE mg/dL
Ketones, POC UA: NEGATIVE mg/dL
Leukocytes, UA: NEGATIVE
Nitrite, UA: NEGATIVE
POC PROTEIN,UA: 30 — AB
Spec Grav, UA: 1.025 (ref 1.010–1.025)
Urobilinogen, UA: 0.2 U/dL
pH, UA: 7 (ref 5.0–8.0)

## 2024-04-29 LAB — POCT URINE PREGNANCY: Preg Test, Ur: NEGATIVE

## 2024-04-29 MED ORDER — TAMSULOSIN HCL 0.4 MG PO CAPS: 0.4000 mg | ORAL_CAPSULE | Freq: Every day | ORAL | 0 refills | Status: DC

## 2024-04-29 MED ORDER — NITROFURANTOIN MONOHYD MACRO 100 MG PO CAPS: 100.0000 mg | ORAL_CAPSULE | Freq: Two times a day (BID) | ORAL | 0 refills | Status: DC

## 2024-04-29 NOTE — ED Triage Notes (Signed)
 C/O hematuria this morning. Seen through Urgent Care and told no bacteria.  Patient reports increasing urgency and frequency this afternoon and vaginal discharge.

## 2024-04-29 NOTE — ED Notes (Signed)
 Pt completely assessed by EDP and set to discharge. No RN assessment performed.  Pt provided discharge instructions and prescription information. Pt was given the opportunity to ask questions and questions were answered.

## 2024-04-29 NOTE — Discharge Instructions (Addendum)
 You have been diagnosed with gross hematuria.  Urinalysis showed no urinary infection.  You do not need antibiotics.  Please call urology and make an appointment for a follow-up and further studies.  Please come back to ED or go to your PCP if you have new symptoms or symptoms worsen.  It was a pleasure to help you today.  Lorane Sar, [place]

## 2024-04-29 NOTE — ED Provider Notes (Signed)
 Sydney Clark    CSN: [id number] Arrival date & time: 04/29/24  1109      History   Chief Complaint Chief Complaint  Patient presents with   Urinary Frequency   Hematuria   Back Pain    HPI Sydney Clark is a 28 y.o. female.   Patient sent for evaluation of urinary frequency and hematuria beginning 7 days ago, experiencing left lower back pain and left pelvic pain for approximately 1 month, occurring intermittently.  Denies dysuria, fever, vaginal symptoms.  Past Medical History:  Diagnosis Date   Anxiety    Depression     Patient Active Problem List   Diagnosis Date Noted   Eczema 03/31/2023   GERD (gastroesophageal reflux disease) 09/20/2021   HLD (hyperlipidemia) 04/30/2021   Tachycardia 04/30/2021   Anxiety and depression 07/27/2020    Past Surgical History:  Procedure Laterality Date   ADENOIDECTOMY     MYRINGOTOMY WITH TUBE PLACEMENT     TONSILLECTOMY      OB History   No obstetric history on file.      Home Medications    Prior to Admission medications   Medication Sig Start Date End Date Taking? Authorizing Provider  nitrofurantoin, macrocrystal-monohydrate, (MACROBID) 100 MG capsule Take 1 capsule (100 mg total) by mouth 2 (two) times daily. 04/29/24  Yes Trang Bouse R, NP  tamsulosin (FLOMAX) 0.4 MG CAPS capsule Take 1 capsule (0.4 mg total) by mouth daily. 04/29/24  Yes Virdia Ziesmer R, NP  albuterol  (VENTOLIN  HFA) 108 (90 Base) MCG/ACT inhaler TAKE 2 PUFFS BY MOUTH INTO THE LUNGS EVERY 6 HOURS AS NEEDED 12/26/21   Antonette Angeline ORN, NP  buPROPion  (WELLBUTRIN  XL) 150 MG 24 hr tablet Take 1 tablet (150 mg total) by mouth daily. 07/30/23   Antonette Angeline ORN, NP  clobetasol  cream (TEMOVATE ) 0.05 % Apply 1 Application topically 2 (two) times daily. 03/31/23   Antonette Angeline ORN, NP  dimenhyDRINATE (DRAMAMINE) 50 MG tablet Take 50 mg by mouth daily as needed.    [provider]  ondansetron  (ZOFRAN -ODT) 4 MG disintegrating tablet Take 1  tablet (4 mg total) by mouth every 8 (eight) hours as needed for nausea or vomiting. 10/23/21   Antonette Angeline ORN, NP    Family History Family History  Problem Relation Age of Onset   Cirrhosis Mother    Alcohol abuse Mother    Depression Mother    Alcohol abuse Father    Anxiety disorder Sister    Cancer Neg Hx    Heart attack Neg Hx    Stroke Neg Hx     Social History Social History   Tobacco Use   Smoking status: Former    Current packs/day: 0.00    Average packs/day: 0.5 packs/day for 5.0 years (2.5 ttl pk-yrs)    Types: Cigarettes    Start date: 04/28/2013    Quit date: 04/28/2018    Years since quitting: 6.0   Smokeless tobacco: Former  Building services engineer status: Former   Substances: Nicotine, Flavoring  Substance Use Topics   Alcohol use: No   Drug use: Yes    Types: Marijuana    Comment: last smoked this am     Allergies   Patient has no known allergies.   Review of Systems Review of Systems  Genitourinary:  Positive for frequency and hematuria.  Musculoskeletal:  Positive for back pain.     Physical Exam Triage Vital Signs ED Triage Vitals  Encounter Vitals Group  BP 04/29/24 1127 120/82     Girls Systolic BP Percentile --      Girls Diastolic BP Percentile --      Boys Systolic BP Percentile --      Boys Diastolic BP Percentile --      Pulse Rate 04/29/24 1127 100     Resp 04/29/24 1122 18     Temp 04/29/24 1122 98.6 F (37 C)     Temp Source 04/29/24 1122 Oral     SpO2 04/29/24 1122 98 %     Weight --      Height --      Head Circumference --      Peak Flow --      Pain Score 04/29/24 1125 0     Pain Loc --      Pain Education --      Exclude from Growth Chart --    No data found.  Updated Vital Signs BP 120/82 (BP Location: Left Arm)   Pulse 100   Temp 98.3 F (36.8 C) (Oral)   Resp 18   LMP 04/06/2024 (Exact Date)   SpO2 98%   Visual Acuity Right Eye Distance:   Left Eye Distance:   Bilateral Distance:    Right Eye  Near:   Left Eye Near:    Bilateral Near:     Physical Exam Constitutional:      Appearance: Normal appearance.  Eyes:     Extraocular Movements: Extraocular movements intact.  Pulmonary:     Effort: Pulmonary effort is normal.  Abdominal:     Tenderness: There is no abdominal tenderness. There is no right CVA tenderness, left CVA tenderness or guarding.  Neurological:     Mental Status: She is alert and oriented to person, place, and time.      UC Treatments / Results  Labs (all labs ordered are listed, but only abnormal results are displayed) Labs Reviewed  POCT URINE DIPSTICK - Abnormal; Notable for the following components:      Result Value   Color, UA red (*)    Clarity, UA cloudy (*)    Blood, UA large (*)    POC PROTEIN,UA =30 (*)    All other components within normal limits  POCT URINE PREGNANCY - Normal  URINE CULTURE    EKG   Radiology No results found.  Procedures Procedures (including critical care time)  Medications Ordered in UC Medications - No data to display  Initial Impression / Assessment and Plan / UC Course  I have reviewed the triage vital signs and the nursing notes.  Pertinent labs & imaging results that were available during my care of the patient were reviewed by me and considered in my medical decision making (see chart for details).  Urinary frequency  Urinalysis negative, sent for culture, based on symptomology alternative etiology kidney stone, discussed this with patient, empirically placed on Macrobid while urine is pending, additionally prescribed Flomax and discussed administration, recommended over-the-counter medication and nonpharmacological supportive care, given instructions to follow-up with primary doctor for further evaluation if she will need imaging however given strict ER precautions for any pain worsening in severity or increasing amounts of hematuria Final Clinical Impressions(s) / UC Diagnoses   Final diagnoses:   Urinary frequency     Discharge Instructions      Today you are evaluated for urinary symptoms, based on the description of your symptoms it is possible that you have either a bladder infection or a kidney stone, more  information in your packet about both conditions  Your urine test at this time shows no signs of infection but it does show blood, that sample has been sent to the lab for 3 days to see if bacteria can be isolated, if this occurs and we need to change her medicine you will be called  Begin Macrobid twice daily for 5 days for treatment of bacteria as you are symptomatic  Begin tamsulosin every morning for 14 days, this medicine relaxes the urethra which the urine goes through making it easier for stone if it is passing which should help to reduce pain and urinary symptoms  You may take Tylenol and/or Motrin as needed for pain  You may hold warm compresses over the affected area in 10 to 15-minute intervals  Definitive imaging for a kidney stone is an abdominal ultrasound or CT which we do not have available therefore please schedule follow-up appointment with your primary doctor for further evaluation  At any point if you were to have severe abdominal pain or back pain or start to see large quantities of blood please go to nearest emergency department for reevaluation   ED Prescriptions     Medication Sig Dispense Auth. Provider   nitrofurantoin, macrocrystal-monohydrate, (MACROBID) 100 MG capsule Take 1 capsule (100 mg total) by mouth 2 (two) times daily. 10 capsule Tajia Szeliga R, NP   tamsulosin (FLOMAX) 0.4 MG CAPS capsule Take 1 capsule (0.4 mg total) by mouth daily. 14 capsule Chrysten Woulfe, Shelba SAUNDERS, NP      PDMP not reviewed this encounter.   Teresa Shelba SAUNDERS, NP 04/29/24 [phone]

## 2024-04-29 NOTE — ED Triage Notes (Signed)
 Patient reports urinary frequency and blood in urine x 1 week. Patient also complains of left sided back pain x 1 month.  Denies pain at this time. Patient has not taking Symptoms.

## 2024-04-29 NOTE — ED Provider Notes (Signed)
 [facility] Provider Note    Event Date/Time   First MD Initiated Contact with Patient 04/29/24 1824     (approximate)   History   Hematuria    HPI  Sydney Clark is a 28 y.o. female    with a past medical history of urinary frequency, facial laceration, anxiety GAD,  who presents to the ED complaining of hematuria. According to the patient, symptoms started this morning with hematuria.  Patient denies fever, chills, abdominal pain, frequency or dysuria.  Patient went to urgent care she was prescribed with nitrofurantoin, Flomax.  Patient is here due to having bloody vaginal discharge, she endorses does not feel like it is her period.      Patient Active Problem List   Diagnosis Date Noted   Eczema 03/31/2023   GERD (gastroesophageal reflux disease) 09/20/2021   HLD (hyperlipidemia) 04/30/2021   Tachycardia 04/30/2021   Anxiety and depression 07/27/2020     ROS: Patient currently denies any vision changes, tinnitus, difficulty speaking, facial droop, sore throat, chest pain, shortness of breath, abdominal pain, nausea/vomiting/diarrhea, dysuria, or weakness/numbness/paresthesias in any extremity   Physical Exam   Triage Vital Signs: ED Triage Vitals  Encounter Vitals Group     BP 04/29/24 1738 126/75     Girls Systolic BP Percentile --      Girls Diastolic BP Percentile --      Boys Systolic BP Percentile --      Boys Diastolic BP Percentile --      Pulse Rate 04/29/24 1738 98     Resp 04/29/24 1738 16     Temp 04/29/24 1738 99.1 F (37.3 C)     Temp Source 04/29/24 1738 Oral     SpO2 04/29/24 1738 100 %     Weight 04/29/24 1737 145 lb 1 oz (65.8 kg)     Height --      Head Circumference --      Peak Flow --      Pain Score 04/29/24 1737 0     Pain Loc --      Pain Education --      Exclude from Growth Chart --     Most recent vital signs: Vitals:   04/29/24 1738  BP: 126/75  Pulse: 98  Resp: 16  Temp: 99.1 F (37.3 C)   SpO2: 100%     Physical Exam Vitals and nursing note reviewed.  During triage vital signs were normal  General:          Awake, no distress.  CV:                  Good peripheral perfusion.  Resp:               Normal effort. no tachypnea Abd:                 No distention.  Soft nontender.  Negative bilateral CVA Other:               ED Results / Procedures / Treatments   Labs (all labs ordered are listed, but only abnormal results are displayed) Labs Reviewed  URINALYSIS, ROUTINE W REFLEX MICROSCOPIC - Abnormal; Notable for the following components:      Result Value   Color, Urine YELLOW (*)    APPearance HAZY (*)    Hgb urine dipstick LARGE (*)    All other components within normal limits  POC URINE PREG, ED  RADIOLOGY I independently reviewed and interpreted imaging and agree with radiologists findings.      PROCEDURES:  Critical Care performed:   Procedures   MEDICATIONS ORDERED IN ED: Medications - No data to display Clinical Course as of 04/29/24 1937  Fri Apr 29, 2024  1909 Urinalysis, Routine w reflex microscopic -Urine, Clean Catch(!) Negative for UTI, hemoglobin is positive, large [AE]  1909 POC Urine Pregnancy, ED Negative [AE]    Clinical Course User Index [AE] Janit Kast, PA-C    IMPRESSION / MDM / ASSESSMENT AND PLAN / ED COURSE  I reviewed the triage vital signs and the nursing notes.  Differential diagnosis includes, but is not limited to, nephrolithiasis, UTI, hematuria  Patient's presentation is most consistent with acute complicated illness / injury requiring diagnostic workup.   Sydney Clark is a 28 y.o., female who presents today with history of hematuria today.  Patient denies fever, chills, dysuria, vaginal discharge, pruritus.  On a physical exam patient is a stable, abdomen is soft and non tender.  Negative bilateral CVA. Patient does not have abdominal pain, lumbar pain, no fever, no chills.  Patient has  asymptomatic hematuria that will require further studies.  I will refer patient to urology.  Patient was prescribed nitrofurantoin and Flomax at the urgent care.  I did advise patient not to take the treatment.  UA was negative for UTI. Patient's diagnosis is consistent with symptomatic hematuria. I did not order any imaging labs are  reassuring ruling out pregnancy and UTI.  UA showed hemoglobin. I did review the patient's allergies and medications.The patient is in stable and satisfactory condition for discharge home  Patient will be discharged home with prescriptions. Patient is to follow up with urology as needed or otherwise directed. Patient is given ED precautions to return to the ED for any worsening or new symptoms. Discussed plan of care with patient, answered all of patient's questions, Patient agreeable to plan of care. Patient verbalized understanding.  FINAL CLINICAL IMPRESSION(S) / ED DIAGNOSES   Final diagnoses:  Gross hematuria     Rx / DC Orders   ED Discharge Orders     None        Note:  This document was prepared using Dragon voice recognition software and may include unintentional dictation errors.   Janit Kast, PA-C 04/29/24 1937    Viviann Pastor, MD 04/30/24 2230

## 2024-04-29 NOTE — Discharge Instructions (Addendum)
 Today you are evaluated for urinary symptoms, based on the description of your symptoms it is possible that you have either a bladder infection or a kidney stone, more information in your packet about both conditions  Your urine test at this time shows no signs of infection but it does show blood, that sample has been sent to the lab for 3 days to see if bacteria can be isolated, if this occurs and we need to change her medicine you will be called  Begin Macrobid twice daily for 5 days for treatment of bacteria as you are symptomatic  Begin tamsulosin every morning for 14 days, this medicine relaxes the urethra which the urine goes through making it easier for stone if it is passing which should help to reduce pain and urinary symptoms  You may take Tylenol and/or Motrin as needed for pain  You may hold warm compresses over the affected area in 10 to 15-minute intervals  Definitive imaging for a kidney stone is an abdominal ultrasound or CT which we do not have available therefore please schedule follow-up appointment with your primary doctor for further evaluation  At any point if you were to have severe abdominal pain or back pain or start to see large quantities of blood please go to nearest emergency department for reevaluation

## 2024-04-30 ENCOUNTER — Other Ambulatory Visit: Payer: Self-pay

## 2024-04-30 ENCOUNTER — Emergency Department: Payer: Self-pay

## 2024-04-30 ENCOUNTER — Emergency Department
Admission: EM | Admit: 2024-04-30 | Discharge: 2024-04-30 | Disposition: A | Discharge status: Home / Self Care | Payer: Self-pay | Attending: Emergency Medicine | Admitting: Emergency Medicine

## 2024-04-30 DIAGNOSIS — R112 Nausea with vomiting, unspecified: Secondary | ICD-10-CM | POA: Insufficient documentation

## 2024-04-30 DIAGNOSIS — R319 Hematuria, unspecified: Secondary | ICD-10-CM | POA: Insufficient documentation

## 2024-04-30 DIAGNOSIS — R10A2 Flank pain, left side: Secondary | ICD-10-CM | POA: Insufficient documentation

## 2024-04-30 LAB — CBC
HCT: 45.8 % (ref 36.0–46.0)
Hemoglobin: 15.3 g/dL — ABNORMAL HIGH (ref 12.0–15.0)
MCH: 31.9 pg (ref 26.0–34.0)
MCHC: 33.4 g/dL (ref 30.0–36.0)
MCV: 95.6 fL (ref 80.0–100.0)
Platelets: 221 K/uL (ref 150–400)
RBC: 4.79 MIL/uL (ref 3.87–5.11)
RDW: 11.9 % (ref 11.5–15.5)
WBC: 7.5 K/uL (ref 4.0–10.5)
nRBC: 0 % (ref 0.0–0.2)

## 2024-04-30 LAB — COMPREHENSIVE METABOLIC PANEL WITH GFR
ALT: 18 U/L (ref 0–44)
AST: 21 U/L (ref 15–41)
Albumin: 5 g/dL (ref 3.5–5.0)
Alkaline Phosphatase: 34 U/L — ABNORMAL LOW (ref 38–126)
Anion gap: 12 (ref 5–15)
BUN: 12 mg/dL (ref 6–20)
CO2: 22 mmol/L (ref 22–32)
Calcium: 9.4 mg/dL (ref 8.9–10.3)
Chloride: 105 mmol/L (ref 98–111)
Creatinine, Ser: 0.73 mg/dL (ref 0.44–1.00)
GFR, Estimated: >60 mL/min (ref >60)
Glucose, Bld: 102 mg/dL — ABNORMAL HIGH (ref 70–99)
Potassium: 3.7 mmol/L (ref 3.5–5.1)
Sodium: 139 mmol/L (ref 135–145)
Total Bilirubin: 0.8 mg/dL (ref 0.0–1.2)
Total Protein: 7.8 g/dL (ref 6.5–8.1)

## 2024-04-30 LAB — LIPASE, BLOOD: Lipase: 28 U/L (ref 11–51)

## 2024-04-30 MED ORDER — ONDANSETRON HCL 4 MG/2ML IJ SOLN: 4.0000 mg | Freq: Once | INTRAMUSCULAR | Status: DC

## 2024-04-30 MED ORDER — ONDANSETRON 4 MG PO TBDP
4.0000 mg | ORAL_TABLET | Freq: Once | ORAL | Status: AC
  Administered 2024-04-30: 4 mg via ORAL
  Filled 2024-04-30: qty 1

## 2024-04-30 MED ORDER — ONDANSETRON HCL 4 MG PO TABS: 4.0000 mg | ORAL_TABLET | Freq: Four times a day (QID) | ORAL | 0 refills | Status: AC | PRN

## 2024-04-30 MED ORDER — HYDROCODONE-ACETAMINOPHEN 5-325 MG PO TABS: 1.0000 | ORAL_TABLET | Freq: Once | ORAL | Status: DC

## 2024-04-30 MED ORDER — KETOROLAC TROMETHAMINE 15 MG/ML IJ SOLN: 15.0000 mg | Freq: Once | INTRAMUSCULAR | Status: DC

## 2024-04-30 MED ORDER — IBUPROFEN 600 MG PO TABS
600.0000 mg | ORAL_TABLET | Freq: Once | ORAL | Status: AC
  Administered 2024-04-30: 600 mg via ORAL
  Filled 2024-04-30: qty 1

## 2024-04-30 NOTE — ED Notes (Signed)
 Pt given Dc instructions. Pt verbalized understanding of medication and follow up care. Pt ambulatory from Ed without difficulty.

## 2024-04-30 NOTE — Discharge Instructions (Addendum)
 You were seen in the emergency department today for evaluation of your nausea and vomiting as well as blood in your urine.  Your blood work remained reassuring today.  Your CT fortunately did not show signs of a kidney stone.  Possibilities include a passed kidney stone, urine infection that is not obviously showing up on your testing, or other causes that you can discuss further with the urologist.  I included contact information for one of our urology groups.  I sent a prescription for nausea medicine to your pharmacy.  Return to the ER for any new or worsening symptoms.

## 2024-04-30 NOTE — ED Provider Notes (Signed)
 [facility] Provider Note    Event Date/Time   First MD Initiated Contact with Patient 04/30/24 1110     (approximate)   History   Back Pain   HPI  Sydney Clark is a 28 year old female presenting to the emergency department for evaluation of hematuria and flank pain.  Patient reports intermittent back pain for the past few months.  However more recently the pain has been worse.  Yesterday she noticed hematuria.  Went to urgent care was told that her urine did not obviously look infected, but was discharged on Macrobid and Flomax.  Also seen in our ER with similar results.  Instructed to return if symptoms worsen.  Patient reports that today she had worsened left flank pain as well as nausea with an episode of vomiting.  Pain is intermittent, no history of similar.     Physical Exam   Triage Vital Signs: ED Triage Vitals  Encounter Vitals Group     BP 04/30/24 1103 108/73     Girls Systolic BP Percentile --      Girls Diastolic BP Percentile --      Boys Systolic BP Percentile --      Boys Diastolic BP Percentile --      Pulse Rate 04/30/24 1103 (!) 113     Resp 04/30/24 1103 17     Temp 04/30/24 1103 98 F (36.7 C)     Temp Source 04/30/24 1103 Oral     SpO2 04/30/24 1103 100 %     Weight --      Height --      Head Circumference --      Peak Flow --      Pain Score 04/30/24 1059 7     Pain Loc --      Pain Education --      Exclude from Growth Chart --     Most recent vital signs: Vitals:   04/30/24 1103  BP: 108/73  Pulse: (!) 113  Resp: 17  Temp: 98 F (36.7 C)  SpO2: 100%     General: Awake, interactive  CV:  Good peripheral perfusion Resp:  Unlabored respirations Abd:  Nondistended, soft, nontender to palpation Neuro:  Symmetric facial movement, fluid speech   ED Results / Procedures / Treatments   Labs (all labs ordered are listed, but only abnormal results are displayed) Labs Reviewed  CBC - Abnormal; Notable  for the following components:      Result Value   Hemoglobin 15.3 (*)    All other components within normal limits  COMPREHENSIVE METABOLIC PANEL WITH GFR - Abnormal; Notable for the following components:   Glucose, Bld 102 (*)    Alkaline Phosphatase 34 (*)    All other components within normal limits  LIPASE, BLOOD     EKG EKG independently reviewed and interpreted by myself demonstrates:    RADIOLOGY Imaging independently reviewed and interpreted by myself demonstrates:   Formal Radiology Read:  CT Renal Stone Study Result Date: 04/30/2024 CLINICAL DATA:  Worsening lower back pain and nausea. EXAM: CT ABDOMEN AND PELVIS WITHOUT CONTRAST TECHNIQUE: Multidetector CT imaging of the abdomen and pelvis was performed following the standard protocol without IV contrast. RADIATION DOSE REDUCTION: This exam was performed according to the departmental dose-optimization program which includes automated exposure control, adjustment of the mA and/or kV according to patient size and/or use of iterative reconstruction technique. COMPARISON:  None Available. FINDINGS: Lower chest: No acute abnormality. Hepatobiliary: No focal  liver abnormality is seen. No gallstones, gallbladder wall thickening, or biliary dilatation. Pancreas: Unremarkable. No pancreatic ductal dilatation or surrounding inflammatory changes. Spleen: Normal in size without focal abnormality. Adrenals/Urinary Tract: Adrenal glands are unremarkable. Kidneys are normal, without renal calculi, focal lesion, or hydronephrosis. Bladder is unremarkable. Stomach/Bowel: Stomach is within normal limits. Appendix appears normal. No evidence of bowel wall thickening, distention, or inflammatory changes. Vascular/Lymphatic: No significant vascular findings are present. No enlarged abdominal or pelvic lymph nodes. Reproductive: Uterus and bilateral adnexa are unremarkable. Other: No abdominal wall hernia or abnormality. No abdominopelvic ascites.  Musculoskeletal: No acute or significant osseous findings. IMPRESSION: No acute or active process within the abdomen or pelvis. Electronically Signed   By: Suzen Dials M.D.   On: 04/30/2024 11:57    PROCEDURES:  Critical Care performed: No  Procedures   MEDICATIONS ORDERED IN ED: Medications  ondansetron  (ZOFRAN -ODT) disintegrating tablet 4 mg (4 mg Oral Given 04/30/24 1244)  ibuprofen (ADVIL) tablet 600 mg (600 mg Oral Given 04/30/24 1244)     IMPRESSION / MDM / ASSESSMENT AND PLAN / ED COURSE  I reviewed the triage vital signs and the nursing notes.  Differential diagnosis includes, but is not limited to, renal stone, UTI, urethral trauma, malignancy  Patient's presentation is most consistent with acute presentation with potential threat to life or bodily function.  28 year old female presenting to the emergency department for evaluation of hematuria, now with worsening nausea and flank pain.  Will obtain repeat labs as well as CT to evaluate for possible renal stone.  Difficult IV access, so ordered for ODT Zofran  and ibuprofen.   Clinical Course as of 04/30/24 1331  Sat Apr 30, 2024  1327 Patient reassessed and feels improved after receiving medications here.  Labs with reassuring CBC, CMP, normal lipase.  CT without evidence of renal stone or other acute abnormality.  I reviewed her urinalysis obtained yesterday.  Hematuria is present with some white blood cells.  Agree that this is not obviously infected, but in the absence of other etiology of her hematuria, do think a trial of antibiotics while awaiting her urine culture results is reasonable.  Other possible etiologies include hematuria related to a kidney stone that is now passed.  Consideration but lower suspicion for malignancy.  Patient does have follow-up information for urology.  Will DC with prescription for Zofran .  Strict return precautions provided.  Patient discharged in stable condition. [NR]    Clinical  Course User Index [NR] Levander Slate, MD     FINAL CLINICAL IMPRESSION(S) / ED DIAGNOSES   Final diagnoses:  Left flank pain  Hematuria, unspecified type     Rx / DC Orders   ED Discharge Orders          Ordered    ondansetron  (ZOFRAN ) 4 MG tablet  Every 6 hours PRN        04/30/24 1331             Note:  This document was prepared using Dragon voice recognition software and may include unintentional dictation errors.   Levander Slate, MD 04/30/24 [phone]

## 2024-04-30 NOTE — ED Triage Notes (Addendum)
 Pt to ED via POV from home. Pt reports lower back pain and nausea that has gotten worse. Pt reports woke up this morning and was vomiting. Pt was seen here yesterday and at [facility] for increased urinary frequency and vaginal discharge.

## 2024-05-01 LAB — URINE CULTURE: Culture: NO GROWTH

## 2024-05-02 ENCOUNTER — Ambulatory Visit (HOSPITAL_COMMUNITY): Payer: Self-pay

## 2024-05-05 ENCOUNTER — Other Ambulatory Visit: Payer: Self-pay

## 2024-05-05 DIAGNOSIS — R319 Hematuria, unspecified: Secondary | ICD-10-CM | POA: Insufficient documentation

## 2024-05-05 DIAGNOSIS — Z5321 Procedure and treatment not carried out due to patient leaving prior to being seen by health care provider: Secondary | ICD-10-CM | POA: Insufficient documentation

## 2024-05-05 DIAGNOSIS — R112 Nausea with vomiting, unspecified: Secondary | ICD-10-CM | POA: Insufficient documentation

## 2024-05-05 DIAGNOSIS — R1030 Lower abdominal pain, unspecified: Secondary | ICD-10-CM | POA: Insufficient documentation

## 2024-05-05 LAB — CBC WITH DIFFERENTIAL/PLATELET
Abs Immature Granulocytes: 0.02 K/uL (ref 0.00–0.07)
Basophils Absolute: 0.1 K/uL (ref 0.0–0.1)
Basophils Relative: 1 %
Eosinophils Absolute: 0.2 K/uL (ref 0.0–0.5)
Eosinophils Relative: 2 %
HCT: 38.1 % (ref 36.0–46.0)
Hemoglobin: 13 g/dL (ref 12.0–15.0)
Immature Granulocytes: 0 %
Lymphocytes Relative: 28 %
Lymphs Abs: 2.8 K/uL (ref 0.7–4.0)
MCH: 31.9 pg (ref 26.0–34.0)
MCHC: 34.1 g/dL (ref 30.0–36.0)
MCV: 93.4 fL (ref 80.0–100.0)
Monocytes Absolute: 0.6 K/uL (ref 0.1–1.0)
Monocytes Relative: 6 %
Neutro Abs: 6.3 K/uL (ref 1.7–7.7)
Neutrophils Relative %: 63 %
Platelets: 230 K/uL (ref 150–400)
RBC: 4.08 MIL/uL (ref 3.87–5.11)
RDW: 11.8 % (ref 11.5–15.5)
WBC: 9.9 K/uL (ref 4.0–10.5)
nRBC: 0 % (ref 0.0–0.2)

## 2024-05-05 LAB — COMPREHENSIVE METABOLIC PANEL WITH GFR
ALT: 16 U/L (ref 0–44)
AST: 29 U/L (ref 15–41)
Albumin: 4.7 g/dL (ref 3.5–5.0)
Alkaline Phosphatase: 31 U/L — ABNORMAL LOW (ref 38–126)
Anion gap: 15 (ref 5–15)
BUN: 16 mg/dL (ref 6–20)
CO2: 21 mmol/L — ABNORMAL LOW (ref 22–32)
Calcium: 9.5 mg/dL (ref 8.9–10.3)
Chloride: 105 mmol/L (ref 98–111)
Creatinine, Ser: 0.99 mg/dL (ref 0.44–1.00)
GFR, Estimated: >60 mL/min (ref >60)
Glucose, Bld: 153 mg/dL — ABNORMAL HIGH (ref 70–99)
Potassium: 3.4 mmol/L — ABNORMAL LOW (ref 3.5–5.1)
Sodium: 141 mmol/L (ref 135–145)
Total Bilirubin: 0.6 mg/dL (ref 0.0–1.2)
Total Protein: 7.5 g/dL (ref 6.5–8.1)

## 2024-05-05 NOTE — ED Triage Notes (Signed)
 Was seen Friday for hematuria, workup was unremarkable. However symptoms have persisted. States she had a twing in her back and started having pain in lower abdomen and R ribs area. Having N/V and intense pain

## 2024-05-06 ENCOUNTER — Encounter: Payer: Self-pay | Admitting: Internal Medicine

## 2024-05-06 ENCOUNTER — Other Ambulatory Visit: Payer: Self-pay

## 2024-05-06 ENCOUNTER — Emergency Department
Admission: EM | Admit: 2024-05-06 | Discharge: 2024-05-06 | Discharge status: Against Medical Advice | Payer: Self-pay | Attending: Emergency Medicine | Admitting: Emergency Medicine

## 2024-05-06 ENCOUNTER — Emergency Department
Admission: EM | Admit: 2024-05-06 | Discharge: 2024-05-06 | Disposition: A | Discharge status: Home / Self Care | Payer: Self-pay | Attending: Emergency Medicine | Admitting: Emergency Medicine

## 2024-05-06 DIAGNOSIS — R112 Nausea with vomiting, unspecified: Secondary | ICD-10-CM | POA: Insufficient documentation

## 2024-05-06 DIAGNOSIS — E860 Dehydration: Secondary | ICD-10-CM | POA: Insufficient documentation

## 2024-05-06 DIAGNOSIS — R109 Unspecified abdominal pain: Secondary | ICD-10-CM | POA: Insufficient documentation

## 2024-05-06 LAB — URINALYSIS, ROUTINE W REFLEX MICROSCOPIC
Bacteria, UA: NONE SEEN
Bilirubin Urine: NEGATIVE
Bilirubin Urine: NEGATIVE
Glucose, UA: NEGATIVE mg/dL
Glucose, UA: NEGATIVE mg/dL
Ketones, ur: 20 mg/dL — AB
Ketones, ur: 80 mg/dL — AB
Leukocytes,Ua: NEGATIVE
Nitrite: NEGATIVE
Nitrite: NEGATIVE
Protein, ur: 30 mg/dL — AB
Protein, ur: 30 mg/dL — AB
RBC / HPF: >50 RBC/hpf (ref 0–5)
RBC / HPF: >50 RBC/hpf (ref 0–5)
Specific Gravity, Urine: 1.018 (ref 1.005–1.030)
Specific Gravity, Urine: 1.029 (ref 1.005–1.030)
pH: 5 (ref 5.0–8.0)
pH: 6 (ref 5.0–8.0)

## 2024-05-06 LAB — CBC
HCT: 40.6 % (ref 36.0–46.0)
Hemoglobin: 13.8 g/dL (ref 12.0–15.0)
MCH: 32.4 pg (ref 26.0–34.0)
MCHC: 34 g/dL (ref 30.0–36.0)
MCV: 95.3 fL (ref 80.0–100.0)
Platelets: 243 K/uL (ref 150–400)
RBC: 4.26 MIL/uL (ref 3.87–5.11)
RDW: 11.9 % (ref 11.5–15.5)
WBC: 7.9 K/uL (ref 4.0–10.5)
nRBC: 0 % (ref 0.0–0.2)

## 2024-05-06 LAB — POC URINE PREG, ED
Preg Test, Ur: NEGATIVE
Preg Test, Ur: NEGATIVE

## 2024-05-06 LAB — COMPREHENSIVE METABOLIC PANEL WITH GFR
ALT: 15 U/L (ref 0–44)
AST: 19 U/L (ref 15–41)
Albumin: 4.9 g/dL (ref 3.5–5.0)
Alkaline Phosphatase: 36 U/L — ABNORMAL LOW (ref 38–126)
Anion gap: 15 (ref 5–15)
BUN: 14 mg/dL (ref 6–20)
CO2: 23 mmol/L (ref 22–32)
Calcium: 9.4 mg/dL (ref 8.9–10.3)
Chloride: 104 mmol/L (ref 98–111)
Creatinine, Ser: 0.74 mg/dL (ref 0.44–1.00)
GFR, Estimated: >60 mL/min (ref >60)
Glucose, Bld: 89 mg/dL (ref 70–99)
Potassium: 3.8 mmol/L (ref 3.5–5.1)
Sodium: 142 mmol/L (ref 135–145)
Total Bilirubin: 0.7 mg/dL (ref 0.0–1.2)
Total Protein: 7.7 g/dL (ref 6.5–8.1)

## 2024-05-06 LAB — LIPASE, BLOOD: Lipase: 24 U/L (ref 11–51)

## 2024-05-06 MED ORDER — ONDANSETRON 4 MG PO TBDP: 4.0000 mg | ORAL_TABLET | Freq: Three times a day (TID) | ORAL | 0 refills | Status: DC | PRN

## 2024-05-06 MED ORDER — LACTATED RINGERS IV BOLUS
1000.0000 mL | Freq: Once | INTRAVENOUS | Status: AC
  Administered 2024-05-06: 1000 mL via INTRAVENOUS

## 2024-05-06 MED ORDER — DROPERIDOL 2.5 MG/ML IJ SOLN
0.6250 mg | Freq: Once | INTRAMUSCULAR | Status: AC
  Administered 2024-05-06: 0.625 mg via INTRAVENOUS
  Filled 2024-05-06: qty 2

## 2024-05-06 NOTE — Telephone Encounter (Signed)
 She will still be billed for the labs on today's date so we would have to wait until Monday to do a followup I believe.

## 2024-05-06 NOTE — ED Provider Notes (Signed)
 [facility] Provider Note    Event Date/Time   First MD Initiated Contact with Patient 05/06/24 1346     (approximate)   History   Abdominal Pain   HPI  Sydney Clark is a 28 y.o. female who presents to the ED for evaluation of Abdominal Pain   Patient presents to the ED with chronic intermittent abdominal pain, nausea and emesis over the past week.  She has had 2 previous ED visits, urgent care visit.  Additionally she checked in overnight to be seen due to long waits left and returned today.  Patient reports frequent nausea and emesis at home associated with suprapubic discomfort.  No surgical abdominal history.  No urinary symptoms   Physical Exam   Triage Vital Signs: ED Triage Vitals  Encounter Vitals Group     BP 05/06/24 1305 116/72     Girls Systolic BP Percentile --      Girls Diastolic BP Percentile --      Boys Systolic BP Percentile --      Boys Diastolic BP Percentile --      Pulse Rate 05/06/24 1305 (!) 118     Resp 05/06/24 1305 18     Temp 05/06/24 1305 98.4 F (36.9 C)     Temp src --      SpO2 05/06/24 1305 100 %     Weight --      Height --      Head Circumference --      Peak Flow --      Pain Score 05/06/24 1303 0     Pain Loc --      Pain Education --      Exclude from Growth Chart --     Most recent vital signs: Vitals:   05/06/24 1345 05/06/24 1414  BP:  (!) 105/55  Pulse:  79  Resp:  18  Temp:    SpO2: 100% 100%    General: Awake, no distress.  CV:  Good peripheral perfusion.  Resp:  Normal effort.  Abd:  No distention.  Soft and nontender MSK:  No deformity noted.  Neuro:  No focal deficits appreciated. Other:     ED Results / Procedures / Treatments   Labs (all labs ordered are listed, but only abnormal results are displayed) Labs Reviewed  COMPREHENSIVE METABOLIC PANEL WITH GFR - Abnormal; Notable for the following components:      Result Value   Alkaline Phosphatase 36 (*)    All other  components within normal limits  URINALYSIS, ROUTINE W REFLEX MICROSCOPIC - Abnormal; Notable for the following components:   Color, Urine YELLOW (*)    APPearance CLOUDY (*)    Hgb urine dipstick MODERATE (*)    Ketones, ur 80 (*)    Protein, ur 30 (*)    Leukocytes,Ua SMALL (*)    All other components within normal limits  POC URINE PREG, ED - Normal  URINE CULTURE  LIPASE, BLOOD  CBC    EKG   RADIOLOGY   Official radiology report(s): No results found.  PROCEDURES and INTERVENTIONS:  Procedures  Medications  lactated ringers bolus 1,000 mL (0 mLs Intravenous Stopped 05/06/24 1608)  droperidol (INAPSINE) 2.5 MG/ML injection 0.625 mg (0.625 mg Intravenous Given 05/06/24 1525)     IMPRESSION / MDM / ASSESSMENT AND PLAN / ED COURSE  I reviewed the triage vital signs and the nursing notes.  Differential diagnosis includes, but is not limited to, cyclic vomiting, gastroenteritis,  dehydration, UTI  {Patient presents with symptoms of an acute illness or injury that is potentially life-threatening.  Patient presents with signs of dehydration and abdominal cramping without evidence of further acute pathology and suitable for outpatient management.  Reassuring exam.  Appears somewhat dry and has many ketones in the urine suggestive of dehydration.  UA clouded by squamous cells without clear signs of cystitis considering her symptoms.  Normal CBC, lipase and metabolic panel.  Send her urine for culture but abstain from antibiotics at this time.  Feeling much better with antiemetics and IV fluids.  Tolerating p.o. and suitable for outpatient management.  Clinical Course as of 05/06/24 1904  [place] May 06, 2024  1620 Reassessed, feeling well, was appreciative.  Discussed pending urine culture, prescription for antiemetics and ED return precautions [DS]    Clinical Course User Index [DS] Claudene Rover, MD     FINAL CLINICAL IMPRESSION(S) / ED DIAGNOSES   Final diagnoses:   Dehydration  Nausea and vomiting, unspecified vomiting type     Rx / DC Orders   ED Discharge Orders          Ordered    ondansetron  (ZOFRAN -ODT) 4 MG disintegrating tablet  Every 8 hours PRN        05/06/24 1621             Note:  This document was prepared using Dragon voice recognition software and may include unintentional dictation errors.   Claudene Rover, MD 05/06/24 JUDITHANN

## 2024-05-06 NOTE — ED Triage Notes (Addendum)
 Pt comes with c/o belly pain, nausea and vomiting. Pt states she had to leave before getting seen last night. Pt states she saw her labs and they were abnormal in her chart also. Pt just unsure what is going on. Pt states they did do some scans  but everything had checked out. Pt tried to go to pcp but was told she couldn't see results

## 2024-05-06 NOTE — ED Notes (Signed)
 Pt advised she gave a urine sample in triage, however same has not been shown collected. Pt encouraged to give another sample.

## 2024-05-08 LAB — URINE CULTURE

## 2024-05-09 ENCOUNTER — Ambulatory Visit: Payer: Self-pay | Admitting: Internal Medicine

## 2024-05-09 ENCOUNTER — Encounter: Payer: Self-pay | Admitting: Internal Medicine

## 2024-05-09 ENCOUNTER — Ambulatory Visit: Payer: Self-pay

## 2024-05-09 ENCOUNTER — Ambulatory Visit (INDEPENDENT_AMBULATORY_CARE_PROVIDER_SITE_OTHER): Payer: Self-pay | Admitting: Internal Medicine

## 2024-05-09 VITALS — BP 112/68 | Ht 66.0 in | Wt 145.6 lb

## 2024-05-09 DIAGNOSIS — F419 Anxiety disorder, unspecified: Secondary | ICD-10-CM

## 2024-05-09 DIAGNOSIS — F32A Depression, unspecified: Secondary | ICD-10-CM

## 2024-05-09 DIAGNOSIS — R310 Gross hematuria: Secondary | ICD-10-CM

## 2024-05-09 DIAGNOSIS — M545 Low back pain, unspecified: Secondary | ICD-10-CM

## 2024-05-09 LAB — POCT URINE DIPSTICK
Bilirubin, UA: NEGATIVE
Glucose, UA: NEGATIVE mg/dL
Nitrite, UA: NEGATIVE
POC PROTEIN,UA: NEGATIVE
Spec Grav, UA: 1.015 (ref 1.010–1.025)
Urobilinogen, UA: 0.2 U/dL
pH, UA: 6.5 (ref 5.0–8.0)

## 2024-05-09 MED ORDER — CYCLOBENZAPRINE HCL 5 MG PO TABS: 5.0000 mg | ORAL_TABLET | Freq: Three times a day (TID) | ORAL | 0 refills | Status: AC | PRN

## 2024-05-09 MED ORDER — HYDROXYZINE HCL 10 MG PO TABS: 10.0000 mg | ORAL_TABLET | Freq: Three times a day (TID) | ORAL | 0 refills | Status: DC | PRN

## 2024-05-09 NOTE — Telephone Encounter (Signed)
 FYI Only or Action Required?: FYI only for provider.  Patient was last seen in primary care on 03/31/2023 by Antonette Angeline ORN, NP.  Called Nurse Triage reporting Hematuria.  Symptoms began 04/29/2024 and worsening.  Interventions attempted: Other: been to ED several times and no relief.  Symptoms are: gradually worsening.  Triage Disposition: See HCP Within 4 Hours (Or PCP Triage)  Patient/caregiver understands and will follow disposition?: Yes    Copied from CRM #[id number]. Topic: Clinical - Red Word Triage >> May 09, 2024  8:39 AM Harlene ORN wrote: Red Word that prompted transfer to Nurse Triage: blood in her urine; nausea back and forth to the ER 4 times since last week. Reason for Disposition  [1] Pain or burning with passing urine AND [2] side (flank) or back pain present  Answer Assessment - Initial Assessment Questions 1. COLOR of URINE: Describe the color of the urine.  (e.g., tea-colored, pink, red, bloody) Do you have blood clots in your urine? (e.g., none, pea, grape, small coin)     Red, enough to change the color of the water 2. ONSET: When did the bleeding start?      10/10 & last night  3. EPISODES: How many times has there been blood in the urine? or How many times today?     2 4. PAIN with URINATION: Is there any pain with passing your urine? If Yes, ask: How bad is the pain?  (Scale 1-10; or mild, moderate, severe)     no 5. FEVER: Do you have a fever? If Yes, ask: What is your temperature, how was it measured, and when did it start?     no 6. ASSOCIATED SYMPTOMS: Are you passing urine more frequently than usual?     Increased frequency 7. OTHER SYMPTOMS: Do you have any other symptoms? (e.g., back/flank pain, abdomen pain, vomiting)     Low left back pain comes and goes and low left abd pain comes and goes right low abd pain comes and goes 8. PREGNANCY: Is there any chance you are pregnant? When was your last menstrual period?      na  10/10 nausea began and has been continuous.  Vomiting at times.  At times pt feels burning in vaginal area, right pain in side  Protocols used: Urine - Blood In-A-AH

## 2024-05-09 NOTE — Telephone Encounter (Signed)
Will discuss at upcoming appointment today 

## 2024-05-09 NOTE — Progress Notes (Signed)
 Subjective:    Patient ID: Sydney Clark, female    DOB: 05-05-1996, 28 y.o.   MRN: [id number]  HPI   Discussed the use of AI scribe software for clinical note transcription with the patient, who gave verbal consent to proceed.  Sydney Clark is a 28 year old female who presents with hematuria and abdominal pain.  She initially presented to urgent care on October 10th with left lower back pain, left lower pelvic pain, and hematuria. A urinalysis showed a large amount of blood. She was started on macrobid and flomax, which she was advised to discontinue after an ER visit the same day. No imaging was obtained at that time, and she was referred to urology.  She returned to the ER on October 11th with similar symptoms. A CBC, CMET, and lipase were unremarkable, and a CT renal stone study was negative for stones. She was discharged the same day. On October 17th, she presented again to the ER with similar symptoms but left without being seen initially, returning later that day. Urinalysis again showed large blood, urine pregnancy was negative, and urine culture was contaminated. She was given IV fluids during her ER visit and again advised to follow-up with urology.  She reports visible blood in her urine twice. The abdominal pain is intermittent but intense, located in the right lower abdomen, and sometimes radiates up to her ribs. The pain is not sharp but comes on suddenly and is not related to meals. No bloating, gassiness, constipation, diarrhea, or blood in her stool. Her period was a week early, and she started noticing blood in her urine on a Friday morning, with her period starting later that night.  She currently experiences back pain, which she attributes to a cough that caused something to 'catch' in her back. She does not believe it is related to her kidneys. She has a history of smoking and denies any family history of bladder cancer. She reports high ketones in her urine and elevated  glucose levels during a recent ER visit, but no diagnosis of diabetes has been made.  She has been experiencing increased anxiety, which she believes is contributing to her nausea and shaky legs. She has previously taken bupropion  and buspirone  for anxiety, and does not consider buspirone  effective. She has also taken hydroxyzine , but does not know if it was helpful because she only used it as needed when taking other medication.       Review of Systems     Past Medical History:  Diagnosis Date   Anxiety    Depression     Current Outpatient Medications  Medication Sig Dispense Refill   albuterol  (VENTOLIN  HFA) 108 (90 Base) MCG/ACT inhaler TAKE 2 PUFFS BY MOUTH INTO THE LUNGS EVERY 6 HOURS AS NEEDED 8.5 each 1   buPROPion  (WELLBUTRIN  XL) 150 MG 24 hr tablet Take 1 tablet (150 mg total) by mouth daily. 60 tablet 0   clobetasol  cream (TEMOVATE ) 0.05 % Apply 1 Application topically 2 (two) times daily. 30 g 0   dimenhyDRINATE (DRAMAMINE) 50 MG tablet Take 50 mg by mouth daily as needed.     nitrofurantoin, macrocrystal-monohydrate, (MACROBID) 100 MG capsule Take 1 capsule (100 mg total) by mouth 2 (two) times daily. 10 capsule 0   ondansetron  (ZOFRAN -ODT) 4 MG disintegrating tablet Take 1 tablet (4 mg total) by mouth every 8 (eight) hours as needed for nausea or vomiting. 30 tablet 0   ondansetron  (ZOFRAN -ODT) 4 MG disintegrating tablet Take 1  tablet (4 mg total) by mouth every 8 (eight) hours as needed. 20 tablet 0   tamsulosin (FLOMAX) 0.4 MG CAPS capsule Take 1 capsule (0.4 mg total) by mouth daily. 14 capsule 0   No current facility-administered medications for this visit.    No Known Allergies  Family History  Problem Relation Age of Onset   Cirrhosis Mother    Alcohol abuse Mother    Depression Mother    Alcohol abuse Father    Anxiety disorder Sister    Cancer Neg Hx    Heart attack Neg Hx    Stroke Neg Hx     Social History   Socioeconomic History   Marital status:  Married    Spouse name: Not on file   Number of children: 0   Years of education: Not on file   Highest education level: 10th grade  Occupational History   Not on file  Tobacco Use   Smoking status: Former    Current packs/day: 0.00    Average packs/day: 0.5 packs/day for 5.0 years (2.5 ttl pk-yrs)    Types: Cigarettes    Start date: 04/28/2013    Quit date: 04/28/2018    Years since quitting: 6.0   Smokeless tobacco: Former  Building services engineer status: Former   Substances: Nicotine, Flavoring  Substance and Sexual Activity   Alcohol use: No   Drug use: Yes    Types: Marijuana    Comment: last smoked this am   Sexual activity: Yes    Birth control/protection: None  Other Topics Concern   Not on file  Social History Narrative   Not on file   Social Drivers of Health   Financial Resource Strain: Medium Risk (09/21/2018)   Overall Financial Resource Strain (CARDIA)    Difficulty of Paying Living Expenses: Somewhat hard  Food Insecurity: Food Insecurity Present (09/21/2018)   Hunger Vital Sign    Worried About Running Out of Food in the Last Year: Sometimes true    Ran Out of Food in the Last Year: Sometimes true  Transportation Needs: Unmet Transportation Needs (09/21/2018)   PRAPARE - Administrator, Civil Service (Medical): Yes    Lack of Transportation (Non-Medical): Yes  Physical Activity: Inactive (09/21/2018)   Exercise Vital Sign    Days of Exercise per Week: 0 days    Minutes of Exercise per Session: 0 min  Stress: Not on file  Social Connections: Not on file  Intimate Partner Violence: Not At Risk (09/21/2018)   Humiliation, Afraid, Rape, and Kick questionnaire    Fear of Current or Ex-Partner: No    Emotionally Abused: No    Physically Abused: No    Sexually Abused: No     Constitutional: Denies fever, malaise, fatigue, headache or abrupt weight changes.  HEENT: Denies eye pain, eye redness, ear pain, ringing in the ears, wax buildup, runny nose,  nasal congestion, bloody nose, or sore throat. Respiratory: Denies difficulty breathing, shortness of breath, cough or sputum production.   Cardiovascular: Denies chest pain, chest tightness, palpitations or swelling in the hands or feet.  Gastrointestinal: Patient reports RLQ abdominal pain.  Denies bloating, constipation, diarrhea or blood in the stool.  GU: Patient reports blood in urine.  Denies urgency, frequency, pain with urination, burning sensation, odor or discharge. Musculoskeletal: Patient reports right sided low back pain.  Denies decrease in range of motion, difficulty with gait, or joint pain and swelling.  Skin: Patient reports rash to left foot.  Denies redness, lesions or ulcercations.  Neurological: Denies dizziness, difficulty with memory, difficulty with speech or problems with balance and coordination.  Psych: Patient has a history of anxiety and depression.  Denies SI/HI.  No other specific complaints in a complete review of systems (except as listed in HPI above).  Objective:   Physical Exam BP 112/68 (BP Location: Right Arm, Patient Position: Sitting, Cuff Size: Normal)   Ht 5' 6 (1.676 m)   Wt 145 lb 9.6 oz (66 kg)   LMP 05/07/2024 (Approximate)   BMI 23.50 kg/m    Wt Readings from Last 3 Encounters:  04/29/24 145 lb 1 oz (65.8 kg)  08/25/23 145 lb (65.8 kg)  03/31/23 148 lb (67.1 kg)    General: Appears her stated age, well developed, well nourished in NAD. Skin: Warm, dry and intact.   HEENT: Head: normal shape and size; Eyes: sclera white, no icterus, conjunctiva pink, PERRLA and EOMs intact; Cardiovascular: Tachycardic with normal rhythm. S1,S2 noted.  No murmur, rubs or gallops noted.  Pulmonary/Chest: Normal effort and positive vesicular breath sounds. No respiratory distress. No wheezes, rales or ronchi noted.  Abdomen: Soft and nontender. Normal bowel sounds.  No CVA tenderness noted. Musculoskeletal: She has difficulty getting from a sitting to a  standing position.  Normal flexion, extension, rotation and lateral bending secondary to pain but pain with extension of the spine.  Pain with palpation of the right paralumbar muscles.  Strength 4/5 BLE.  No difficulty with gait.  Neurological: Alert and oriented.  Psychiatric: Mood and affect normal.  Mildly anxious appearing. Judgment and thought content normal.     BMET    Component Value Date/Time   NA 142 05/06/2024 1308   NA 140 07/06/2014 1143   K 3.8 05/06/2024 1308   K 3.9 07/06/2014 1143   CL 104 05/06/2024 1308   CL 107 07/06/2014 1143   CO2 23 05/06/2024 1308   CO2 23 07/06/2014 1143   GLUCOSE 89 05/06/2024 1308   GLUCOSE 102 (H) 07/06/2014 1143   BUN 14 05/06/2024 1308   BUN 9 07/06/2014 1143   CREATININE 0.74 05/06/2024 1308   CREATININE 1.00 (H) 03/25/2022 1015   CALCIUM 9.4 05/06/2024 1308   CALCIUM 9.5 07/06/2014 1143   GFRNONAA >60 05/06/2024 1308   GFRNONAA 87 01/24/2020 1055   GFRAA 100 01/24/2020 1055    Lipid Panel     Component Value Date/Time   CHOL 158 03/25/2022 1015   TRIG 84 03/25/2022 1015   HDL 53 03/25/2022 1015   CHOLHDL 3.0 03/25/2022 1015   LDLCALC 88 03/25/2022 1015    CBC    Component Value Date/Time   WBC 7.9 05/06/2024 1308   RBC 4.26 05/06/2024 1308   HGB 13.8 05/06/2024 1308   HGB 15.3 07/06/2014 1143   HCT 40.6 05/06/2024 1308   HCT 45.0 07/06/2014 1143   PLT 243 05/06/2024 1308   PLT 236 07/06/2014 1143   MCV 95.3 05/06/2024 1308   MCV 96 07/06/2014 1143   MCH 32.4 05/06/2024 1308   MCHC 34.0 05/06/2024 1308   RDW 11.9 05/06/2024 1308   RDW 12.4 07/06/2014 1143   LYMPHSABS 2.8 05/05/2024 2259   MONOABS 0.6 05/05/2024 2259   EOSABS 0.2 05/05/2024 2259   BASOSABS 0.1 05/05/2024 2259    Hgb A1C Lab Results  Component Value Date   HGBA1C 5.0 03/25/2022           Assessment & Plan:  Assessment and Plan    Multiple  urgent care and ER follow-up for hematuria with flank pain Urgent care/ER notes, labs and  imaging reviewed intermittent hematuria with left lower abdominal and flank pain. CT negative for stones. Possible hemorrhagic cystitis due to bladder irritation from smoking. Bladder cancer unlikely. - Repeat urinalysis today. - Send urine culture if urinalysis suggests infection. - Advise urology follow-up for further evaluation, including potential cystoscopy. - Encourage increased fluid intake. - Advise avoiding bladder irritants such as caffeine, smoking, and chocolate.  Acute musculoskeletal low back pain Pain likely muscular due to recent coughing episode. CT scan normal. - Prescribe cyclobenzaprine  5 mg, up to three times a day as needed, with caution if working or driving.  Anxiety disorder Increased anxiety symptoms, possibly exacerbated by current health issues. Hydroxyzine  preferred for non-habit forming properties. - Prescribe hydroxyzine  10 mg, every eight hours as needed.  -Support offered     Schedule an appointment for your annual exam Angeline Laura, NP

## 2024-05-09 NOTE — Patient Instructions (Signed)
Hemorrhagic Cystitis Hemorrhagic cystitis is bleeding from damage to the inner lining of the bladder. This condition results when the inner lining of the bladder (transitional epithelium) is damaged along with the blood vessels that supply the area. Hemorrhagic cystitis may make it difficult or painful to pass urine. What are the causes? This condition may be caused by: Damage from certain cancer treatments. This is the most common cause. It can result from: Radiation treatment that involves the bladder. Chemotherapy drugs used to treat certain cancers or to treat people who have a bone marrow transplant (cyclophosphamide and ifosfamide). Infections with bacteria or viruses, especially in people with a weak body defense system (immune system). Rare causes of the condition include: Other drugs, including penicillin drugs and a type of steroid (danazol). Exposure to toxic chemicals used in dyes, markers, shoe polish, or pesticides. What are the signs or symptoms? The main sign of this condition is blood in the urine (hematuria). This can range from very mild to severe. Mild hematuria can include microscopic bleeding that does not change the color of your urine. Severe hematuria can cause you to have urine with bright red blood or blood clots in it. In some cases, severe hematuria can cause large clots that fill the bladder and block urine flow (urinary obstruction). Hemorrhagic cystitis may also cause symptoms such as: An urgent or frequent need to pass urine. Pain when passing urine. Lower belly pain and fullness. Urinary obstruction. How is this diagnosed? This condition may be diagnosed based on: Your symptoms and medical history. A physical exam. Tests, such as: Urine tests to check for blood or signs of infection. Blood tests to check for signs of infection and a low red blood cell count due to bleeding. Imaging tests of the bladder, such as ultrasound, CT scan, or MRI. A procedure to  examine the inside of your bladder using a flexible scope (cystoscopy). How is this treated? Treatment depends on the cause of the condition and how severe the bleeding is. If you are being treated with chemotherapy drugs, you may be given other medicines to reduce the risk for this condition during treatment. Other treatments may include: Removing your exposure to substances that are causing the condition, such as a chemical toxin or a medicine. Taking antibiotic or antiviral medicine if the condition is caused by an infection. You may also be treated for hematuria. This can include: Fluids (hydration), bed rest, and observation. These methods may be all that is needed if you are able to pass urine and have no blood clots. Placing a flexible tube (catheter) into the bladder to continuously flush out (irrigate) the bladder with sterile saline solution. This may be needed if clots are passing or if bleeding is continuing. Medicine to reduce bleeding. A cystoscopy to remove clots if clots are filling the bladder. This may also include a procedure to stop bleeding (coagulation). A transfusion to replace blood loss. You may need surgery to stop bleeding or to remove the bladder if other treatments have not helped. Follow these instructions at home:  If you had surgery, your health care provider will give you instructions for taking care of yourself at home after your procedure. Follow these instructions carefully. Take over-the-counter and prescription medicines only as told by your health care provider. If you were prescribed an antibiotic medicine, take it as told by your health care provider. Do not stop using the antibiotic even if you start to feel better. Return to your normal activities as told  by your health care provider. Ask your health care provider what activities are safe for you. Drink enough fluid to keep your urine pale yellow. Keep all follow-up visits as told by your health care  provider. This is important. Contact a health care provider if you have: Chills or a fever. Blood in your urine. An urgent or frequent need to pass urine. Pain when passing urine. Get help right away if you: Have bright red blood or clots in your urine. Are unable to pass urine. Summary Hemorrhagic cystitis is bleeding caused by damage to the inner lining of your bladder. Hemorrhagic cystitis may make it difficult or painful to pass urine. This condition may be caused by damage from infections, radiation therapy, or chemotherapy drugs. Blood in the urine (hematuria) is the main sign of hemorrhagic cystitis. Hematuria can be very mild and involve microscopic bleeding that does not change the color of urine. It can also be severe and include passing urine with bright red blood or blood clots in it. Treatment for hemorrhagic cystitis depends on the cause and severity of the condition. In most cases, the condition will clear up with supportive care that may include rest, fluids, and antibiotics, along with removing exposure to the cause. Other treatments may be needed in more serious cases. This information is not intended to replace advice given to you by your health care provider. Make sure you discuss any questions you have with your health care provider. Document Revised: 06/09/2022 Document Reviewed: 06/09/2022 Elsevier Patient Education  2024 [employer].

## 2024-05-10 ENCOUNTER — Emergency Department
Admission: EM | Admit: 2024-05-10 | Discharge: 2024-05-10 | Disposition: A | Discharge status: Home / Self Care | Payer: Self-pay | Attending: Emergency Medicine | Admitting: Emergency Medicine

## 2024-05-10 ENCOUNTER — Emergency Department: Payer: Self-pay

## 2024-05-10 ENCOUNTER — Other Ambulatory Visit: Payer: Self-pay

## 2024-05-10 DIAGNOSIS — R10A1 Flank pain, right side: Secondary | ICD-10-CM | POA: Insufficient documentation

## 2024-05-10 DIAGNOSIS — R112 Nausea with vomiting, unspecified: Secondary | ICD-10-CM | POA: Insufficient documentation

## 2024-05-10 DIAGNOSIS — R1031 Right lower quadrant pain: Secondary | ICD-10-CM | POA: Insufficient documentation

## 2024-05-10 DIAGNOSIS — R1030 Lower abdominal pain, unspecified: Secondary | ICD-10-CM

## 2024-05-10 DIAGNOSIS — R3915 Urgency of urination: Secondary | ICD-10-CM | POA: Insufficient documentation

## 2024-05-10 LAB — CHLAMYDIA/NGC RT PCR (ARMC ONLY)
Chlamydia Tr: NOT DETECTED
N gonorrhoeae: NOT DETECTED

## 2024-05-10 LAB — URINALYSIS, ROUTINE W REFLEX MICROSCOPIC
Bilirubin Urine: NEGATIVE
Glucose, UA: NEGATIVE mg/dL
Ketones, ur: 80 mg/dL — AB
Leukocytes,Ua: NEGATIVE
Nitrite: NEGATIVE
Protein, ur: 30 mg/dL — AB
Specific Gravity, Urine: 1.024 (ref 1.005–1.030)
pH: 5 (ref 5.0–8.0)

## 2024-05-10 LAB — CBC
HCT: 41.1 % (ref 36.0–46.0)
Hemoglobin: 13.7 g/dL (ref 12.0–15.0)
MCH: 31.9 pg (ref 26.0–34.0)
MCHC: 33.3 g/dL (ref 30.0–36.0)
MCV: 95.8 fL (ref 80.0–100.0)
Platelets: 227 K/uL (ref 150–400)
RBC: 4.29 MIL/uL (ref 3.87–5.11)
RDW: 11.8 % (ref 11.5–15.5)
WBC: 12.8 K/uL — ABNORMAL HIGH (ref 4.0–10.5)
nRBC: 0 % (ref 0.0–0.2)

## 2024-05-10 LAB — URINE CULTURE
MICRO NUMBER:: 17121622
SPECIMEN QUALITY:: ADEQUATE

## 2024-05-10 LAB — BASIC METABOLIC PANEL WITH GFR
Anion gap: 11 (ref 5–15)
BUN: 13 mg/dL (ref 6–20)
CO2: 24 mmol/L (ref 22–32)
Calcium: 9.5 mg/dL (ref 8.9–10.3)
Chloride: 105 mmol/L (ref 98–111)
Creatinine, Ser: 1.02 mg/dL — ABNORMAL HIGH (ref 0.44–1.00)
GFR, Estimated: >60 mL/min (ref >60)
Glucose, Bld: 124 mg/dL — ABNORMAL HIGH (ref 70–99)
Potassium: 3.6 mmol/L (ref 3.5–5.1)
Sodium: 140 mmol/L (ref 135–145)

## 2024-05-10 LAB — WET PREP, GENITAL
Clue Cells Wet Prep HPF POC: NONE SEEN
Sperm: NONE SEEN
Trich, Wet Prep: NONE SEEN
WBC, Wet Prep HPF POC: <10 (ref <10)
Yeast Wet Prep HPF POC: NONE SEEN

## 2024-05-10 LAB — MAGNESIUM: Magnesium: 1.8 mg/dL (ref 1.7–2.4)

## 2024-05-10 LAB — POC URINE PREG, ED: Preg Test, Ur: NEGATIVE

## 2024-05-10 MED ORDER — ONDANSETRON HCL 4 MG/2ML IJ SOLN
4.0000 mg | Freq: Once | INTRAMUSCULAR | Status: AC
  Administered 2024-05-10: 4 mg via INTRAVENOUS
  Filled 2024-05-10: qty 2

## 2024-05-10 MED ORDER — ONDANSETRON 4 MG PO TBDP: 4.0000 mg | ORAL_TABLET | Freq: Three times a day (TID) | ORAL | 0 refills | Status: AC | PRN

## 2024-05-10 MED ORDER — KETOROLAC TROMETHAMINE 15 MG/ML IJ SOLN
15.0000 mg | Freq: Once | INTRAMUSCULAR | Status: AC
  Administered 2024-05-10: 15 mg via INTRAVENOUS
  Filled 2024-05-10: qty 1

## 2024-05-10 MED ORDER — PHENAZOPYRIDINE HCL 100 MG PO TABS: 200.0000 mg | ORAL_TABLET | Freq: Three times a day (TID) | ORAL | 0 refills | Status: AC | PRN

## 2024-05-10 MED ORDER — ACETAMINOPHEN 500 MG PO TABS
1000.0000 mg | ORAL_TABLET | Freq: Once | ORAL | Status: AC
  Administered 2024-05-10: 1000 mg via ORAL
  Filled 2024-05-10: qty 2

## 2024-05-10 MED ORDER — DROPERIDOL 2.5 MG/ML IJ SOLN
2.5000 mg | Freq: Once | INTRAMUSCULAR | Status: AC
  Administered 2024-05-10: 2.5 mg via INTRAVENOUS
  Filled 2024-05-10: qty 2

## 2024-05-10 MED ORDER — SODIUM CHLORIDE 0.9 % IV BOLUS
1000.0000 mL | Freq: Once | INTRAVENOUS | Status: AC
  Administered 2024-05-10: 1000 mL via INTRAVENOUS

## 2024-05-10 NOTE — ED Provider Notes (Signed)
 [facility] Provider Note    Event Date/Time   First MD Initiated Contact with Patient 05/10/24 [phone]     (approximate)   History   Abdominal Pain and Flank Pain   HPI  Sydney Clark is a 28 y.o. female   Past medical history of no significant past medical history here with right-sided flank/lower quadrant pain with urinary urgency for the last several weeks.  Nausea as well and vomiting.  No fever no chills.  No unusual vaginal discharge will occasionally have mucosal discharge but is not unusual for her..  Symptoms ongoing for last several weeks and has been seen multiple times in urgent care and emergency department with unremarkable workups.  Has been referred over to urology and has an appointment upcoming in the beginning of November.  Has not been sexually active in quite some time.  Just finished her menstrual period.  Uses marijuana products daily.  Independent Historian contributed to assessment above: Her partner is at bedside to corroborate information past medical history above  External Medical Documents Reviewed: Recent hospital visits including CT scan earlier this month unremarkable      Physical Exam   Triage Vital Signs: ED Triage Vitals  Encounter Vitals Group     BP 05/10/24 0533 110/79     Girls Systolic BP Percentile --      Girls Diastolic BP Percentile --      Boys Systolic BP Percentile --      Boys Diastolic BP Percentile --      Pulse Rate 05/10/24 0533 (!) 59     Resp 05/10/24 0533 16     Temp 05/10/24 0533 97.6 F (36.4 C)     Temp Source 05/10/24 0533 Oral     SpO2 05/10/24 0533 100 %     Weight 05/10/24 0534 145 lb 8.1 oz (66 kg)     Height 05/10/24 0534 5' 6 (1.676 m)     Head Circumference --      Peak Flow --      Pain Score 05/10/24 0534 10     Pain Loc --      Pain Education --      Exclude from Growth Chart --     Most recent vital signs: Vitals:   05/10/24 0533  BP: 110/79  Pulse: (!) 59   Resp: 16  Temp: 97.6 F (36.4 C)  SpO2: 100%    General: Awake, no distress.  CV:  Good peripheral perfusion.  Resp:  Normal effort.  Abd:  No distention.  Other:  Appears uncomfortable, emesis bag in hand.  Vital signs unremarkable no fever.  Soft abdomen no distention or masses palpated, nonfocal nonperitoneal exam with deep palpation to all quadrants.  Pelvic exam shows white mucousy discharge.  No cervical motion tenderness.  No adnexal masses noted.   ED Results / Procedures / Treatments   Labs (all labs ordered are listed, but only abnormal results are displayed) Labs Reviewed  URINALYSIS, ROUTINE W REFLEX MICROSCOPIC - Abnormal; Notable for the following components:      Result Value   Color, Urine YELLOW (*)    APPearance HAZY (*)    Hgb urine dipstick MODERATE (*)    Ketones, ur 80 (*)    Protein, ur 30 (*)    Bacteria, UA RARE (*)    All other components within normal limits  BASIC METABOLIC PANEL WITH GFR - Abnormal; Notable for the following components:   Glucose, Bld 124 (*)  Creatinine, Ser 1.02 (*)    All other components within normal limits  CBC - Abnormal; Notable for the following components:   WBC 12.8 (*)    All other components within normal limits  WET PREP, GENITAL  CHLAMYDIA/NGC RT PCR ([facility] ONLY)            MAGNESIUM  POC URINE PREG, ED     I ordered and reviewed the above labs they are notable for mild leukocytosis.    PROCEDURES:  Critical Care performed: No  Procedures   MEDICATIONS ORDERED IN ED: Medications  sodium chloride  0.9 % bolus 1,000 mL (1,000 mLs Intravenous New Bag/Given 05/10/24 9391)  ondansetron  (ZOFRAN ) injection 4 mg (4 mg Intravenous Given 05/10/24 9391)  ketorolac (TORADOL) 15 MG/ML injection 15 mg (15 mg Intravenous Given 05/10/24 9391)  acetaminophen (TYLENOL) tablet 1,000 mg (1,000 mg Oral Given 05/10/24 0609)  droperidol (INAPSINE) 2.5 MG/ML injection 2.5 mg (2.5 mg Intravenous Given 05/10/24 0641)      IMPRESSION / MDM / ASSESSMENT AND PLAN / ED COURSE  I reviewed the triage vital signs and the nursing notes.                                Patient's presentation is most consistent with acute presentation with potential threat to life or bodily function.  Differential diagnosis includes, but is not limited to, renal colic, kidney stone, urine infection, pregnancy related complications, appendicitis, torsion, TOA, STD   The patient is on the cardiac monitor to evaluate for evidence of arrhythmia and/or significant heart rate changes.  MDM:    Broad differential diagnosis for ongoing urinary urgency and abdominal pain diffusely, but more localized to the right side flank with seemingly radiation to the right lower abdomen with thus far unremarkable workups and recent evaluations with identical chief complaint, with no frank evidence of urine infections, normal CT scan, and upcoming appointment with urology.  I considered appendicitis or other surgical abdominal pathology but with a recent CT scan unremarkable and a relatively benign nonperitoneal on localizing abdominal exam today I think less likely.  I did consider alternative diagnosis which have not been fully explored yet such as STDs, TOA, ovarian torsion, though I think less likely as she has not been sexually active, has had no vaginal discharge, and has not had a CT scan that showed no adnexal abnormalities.  She would like to undergo pelvic exam, STI testing, and transvaginal ultrasound however today which I have ordered.  For therapeutic treatment, I will give her antiemetic, IV fluids, IV anti-inflammatory pain medications.  Will trial a bladder spasm Pyridium while she awaits her urology appointment in a couple of weeks.  Will trial droperidol as cannabis hyperemesis syndrome is on the differential as well given her daily marijuana use.  I advised her to discontinue use for prolonged period of time to test this hypothesis.   She should still follow-up with urologist as above.   -- Disposition pending the results of her testing as above and p.o. challenge.      FINAL CLINICAL IMPRESSION(S) / ED DIAGNOSES   Final diagnoses:  Right flank pain  Lower abdominal pain  Nausea and vomiting, unspecified vomiting type     Rx / DC Orders   ED Discharge Orders          Ordered    phenazopyridine (PYRIDIUM) 100 MG tablet  3 times daily PRN  05/10/24 0602    ondansetron  (ZOFRAN -ODT) 4 MG disintegrating tablet  Every 8 hours PRN        05/10/24 0602             Note:  This document was prepared using Dragon voice recognition software and may include unintentional dictation errors.    Cyrena Mylar, MD 05/10/24 [phone]

## 2024-05-10 NOTE — Discharge Instructions (Addendum)
 Try the medication for bladder spasms-take as directed.  Please follow-up with your urologist as planned.  Take acetaminophen 650 mg and ibuprofen 400 mg every 6 hours for pain.  Take with food. Take Zofran  as needed for nausea/vomiting.  I have sent a refill to your pharmacy.  We talked about the possibility that this abdominal pain and vomiting may be related to cannabis use.  There is no test to for this condition called cannabis hyperemesis syndrome but it may match your symptoms.  I would advise that you discontinue permanently your use of cannabis products.  Thank you for choosing us  for your health care today!  Please see your primary doctor this week for a follow up appointment.   If you have any new, worsening, or unexpected symptoms call your doctor right away or come back to the emergency department for reevaluation.  It was my pleasure to care for you today.   Ginnie EDISON Cyrena, MD

## 2024-05-10 NOTE — ED Triage Notes (Addendum)
 Patient ambulatory to triage with complaints of right sided abdominal and flank pain around 1730 yesterday. Patient endorses nausea/vomiting, she also states urinary frequency. Denies hx of kidney stones. Patient seen at [facility] yesterday for same with negative CT but states pain is worse and has been ongoing for hours now.

## 2024-05-10 NOTE — ED Provider Notes (Signed)
 Gonorrhea chlamydia negative.  US  PELVIC COMPLETE W TRANSVAGINAL AND TORSION R/O Result Date: 05/10/2024 CLINICAL DATA:  Right-sided abdominal pain. Negative urine pregnancy test. EXAM: TRANSABDOMINAL AND TRANSVAGINAL ULTRASOUND OF PELVIS DOPPLER ULTRASOUND OF OVARIES TECHNIQUE: Both transabdominal and transvaginal ultrasound examinations of the pelvis were performed. Transabdominal technique was performed for global imaging of the pelvis including uterus, ovaries, adnexal regions, and pelvic cul-de-sac. It was necessary to proceed with endovaginal exam following the transabdominal exam to visualize the endometrium and ovaries. Color and duplex Doppler ultrasound was utilized to evaluate blood flow to the ovaries. COMPARISON:  CT urogram 04/30/2024 FINDINGS: Uterus Measurements: 7.0 x 3.0 x 4.0 cm = volume: 44.3 mL. No fibroids or other mass visualized. Endometrium Thickness: 8.9.  No focal abnormality visualized. Right ovary Measurements: 3.9 x 2.6 x 2.7 cm = volume: 14.3 mL. Normal appearance/no adnexal mass. Left ovary Measurements: 3.0 x 1.9 x 2.9 cm = volume: 8.5 mL. Normal appearance/no adnexal mass. Pulsed Doppler evaluation of both ovaries demonstrates normal low-resistance arterial and venous waveforms. Other findings Small amount of free pelvic fluid. IMPRESSION: Normal pelvic ultrasound. Electronically Signed   By: Toribio Agreste M.D.   On: 05/10/2024 07:51     Patient resting comfortably no distress.  She feels well she feels comfortable plan to go home.  Discussed plan of follow-up care, medication prescriptions been provided, and return precautions.  She is awake alert oriented symptoms well-controlled feeling much better.  Appropriate for discharge with outpatient follow-up which she has already set up with urology  Return precautions and treatment recommendations and follow-up discussed with the patient who is agreeable with the plan.     Dicky Anes, MD 05/10/24 [phone]

## 2024-05-31 ENCOUNTER — Ambulatory Visit (INDEPENDENT_AMBULATORY_CARE_PROVIDER_SITE_OTHER): Payer: Self-pay | Admitting: Urology

## 2024-05-31 ENCOUNTER — Encounter: Payer: Self-pay | Admitting: Urology

## 2024-05-31 VITALS — BP 115/75 | HR 121 | Ht 66.0 in | Wt 145.0 lb

## 2024-05-31 DIAGNOSIS — R3129 Other microscopic hematuria: Secondary | ICD-10-CM

## 2024-05-31 DIAGNOSIS — R310 Gross hematuria: Secondary | ICD-10-CM

## 2024-05-31 DIAGNOSIS — Z87898 Personal history of other specified conditions: Secondary | ICD-10-CM

## 2024-05-31 LAB — URINALYSIS, COMPLETE
Bilirubin, UA: NEGATIVE
Glucose, UA: NEGATIVE
Ketones, UA: NEGATIVE
Leukocytes,UA: NEGATIVE
Nitrite, UA: NEGATIVE
Protein,UA: NEGATIVE
RBC, UA: NEGATIVE
Specific Gravity, UA: 1.025 (ref 1.005–1.030)
Urobilinogen, Ur: 0.2 mg/dL (ref 0.2–1.0)
pH, UA: 6 (ref 5.0–7.5)

## 2024-05-31 LAB — MICROSCOPIC EXAMINATION: Epithelial Cells (non renal): >10 /HPF — AB (ref 0–10)

## 2024-05-31 NOTE — Progress Notes (Signed)
 05/31/2024 12:48 PM   Sydney Clark 1996-06-20 [id number]  Referring provider: Antonette Angeline ORN, NP [address] [place],  [place] [address]  Chief Complaint  Patient presents with   Abdominal Pain   Flank Pain    HPI: Sydney Clark is a 28 y.o. female referred for evaluation of hematuria, flank and abdominal pain.  Initially presented to [facility] 04/29/2024 with a 1 week history of urinary frequency, urgency and pink-tinged urine.  Urine was grossly bloody with dipstick urinalysis showing large blood.  Started empirically on Macrobid and tamsulosin.  Urine culture was subsequently negative Seen [facility] ED later that afternoon 04/29/2024 complaining of increased frequency, urgency and bloody vaginal discharge.  Urinalysis showed >50 RBC on microscopy/6-10 WBC and no significant squamous epithelial cells.  Urology follow-up was recommended Return to the ED 04/30/2024 with worsening right flank and right lower quadrant abdominal pain.  CT renal stone study was performed which showed no hydronephrosis or urinary tract calculi Return ED visit 05/06/2024 complaining of worsening right lower quadrant abdominal pain and right flank pain.  She also had nausea and vomiting.  Repeat UA >50 RBC/21-50 WBC and 11-20 squamous epithelial cells; urine culture subsequently grew multiple species.  Symptoms improved with antibiotics and IV fluids and she was discharged Return ED visit 05/10/2024 with worsening right lower quadrant/right flank pain with nausea and vomiting.  UA showed 6-10 RBC.  She had negative STI testing and a pelvic/transvaginal ultrasound showed no abnormalities Shortly after that visit she had resolution of her urinary frequency, urgency and pain.  She has occasional low back pain but overall is feeling much better.  No further gross hematuria   PMH: Past Medical History:  Diagnosis Date   Anxiety    Depression     Surgical History: Past Surgical History:   Procedure Laterality Date   ADENOIDECTOMY     MYRINGOTOMY WITH TUBE PLACEMENT     TONSILLECTOMY      Home Medications:  Allergies as of 05/31/2024   No Known Allergies      Medication List        Accurate as of May 31, 2024 12:48 PM. If you have any questions, ask your nurse or doctor.          clobetasol  cream 0.05 % Commonly known as: TEMOVATE  Apply 1 Application topically 2 (two) times daily.   cyclobenzaprine  5 MG tablet Commonly known as: FLEXERIL  Take 1 tablet (5 mg total) by mouth 3 (three) times daily as needed for muscle spasms.   dimenhyDRINATE 50 MG tablet Commonly known as: DRAMAMINE Take 50 mg by mouth daily as needed.   GAS-X PO Take by mouth.   hydrOXYzine  10 MG tablet Commonly known as: ATARAX  Take 1 tablet (10 mg total) by mouth 3 (three) times daily as needed.   ondansetron  4 MG disintegrating tablet Commonly known as: ZOFRAN -ODT Take 1 tablet (4 mg total) by mouth every 8 (eight) hours as needed.        Allergies: No Known Allergies  Family History: Family History  Problem Relation Age of Onset   Cirrhosis Mother    Alcohol abuse Mother    Depression Mother    Alcohol abuse Father    Anxiety disorder Sister    Cancer Neg Hx    Heart attack Neg Hx    Stroke Neg Hx     Social History:  reports that she quit smoking about 6 years ago. Her smoking use included cigarettes.  She started smoking about 11 years ago. She has a 2.5 pack-year smoking history. She has quit using smokeless tobacco. She reports current drug use. Drug: Marijuana. She reports that she does not drink alcohol.   Physical Exam: BP 115/75   Pulse (!) 121   Ht 5' 6 (1.676 m)   Wt 145 lb (65.8 kg)   LMP 05/07/2024 (Approximate)   BMI 23.40 kg/m   Constitutional:  Alert, No acute distress. HEENT: [place] AT Respiratory: Normal respiratory effort, no increased work of breathing. Psychiatric: Normal mood and affect.  Laboratory Data:  Urinalysis Dipstick  negative Microscopy no RBC/WBC; >10 epis   Pertinent Imaging: CT images were personally reviewed and interpreted  CT Renal Stone Study  Narrative CLINICAL DATA:  Worsening lower back pain and nausea.  EXAM: CT ABDOMEN AND PELVIS WITHOUT CONTRAST  TECHNIQUE: Multidetector CT imaging of the abdomen and pelvis was performed following the standard protocol without IV contrast.  RADIATION DOSE REDUCTION: This exam was performed according to the departmental dose-optimization program which includes automated exposure control, adjustment of the mA and/or kV according to patient size and/or use of iterative reconstruction technique.  COMPARISON:  None Available.  FINDINGS: Lower chest: No acute abnormality.  Hepatobiliary: No focal liver abnormality is seen. No gallstones, gallbladder wall thickening, or biliary dilatation.  Pancreas: Unremarkable. No pancreatic ductal dilatation or surrounding inflammatory changes.  Spleen: Normal in size without focal abnormality.  Adrenals/Urinary Tract: Adrenal glands are unremarkable. Kidneys are normal, without renal calculi, focal lesion, or hydronephrosis. Bladder is unremarkable.  Stomach/Bowel: Stomach is within normal limits. Appendix appears normal. No evidence of bowel wall thickening, distention, or inflammatory changes.  Vascular/Lymphatic: No significant vascular findings are present. No enlarged abdominal or pelvic lymph nodes.  Reproductive: Uterus and bilateral adnexa are unremarkable.  Other: No abdominal wall hernia or abnormality. No abdominopelvic ascites.  Musculoskeletal: No acute or significant osseous findings.  IMPRESSION: No acute or active process within the abdomen or pelvis.   Electronically Signed By: Suzen Dials M.D. On: 04/30/2024 11:57   Assessment & Plan:   28 y.o. female with a prior 2-week history of gross hematuria, microhematuria, right flank/abdominal pain and urinary frequency  and urgency.  Although renal stone CT study was negative symptoms are suspicious for a passed calculus as her symptoms have spontaneously resolved and her urinalysis today is normal Recommend cystoscopy to make sure there are no other lower tract etiologies of her hematuria If cystoscopy is negative and symptoms recur recommend CT urogram   Glendia JAYSON Barba, MD  [facility] [address] [place], [place] [address] [phone]

## 2024-05-31 NOTE — Patient Instructions (Signed)

## 2024-06-06 ENCOUNTER — Other Ambulatory Visit: Payer: Self-pay | Admitting: Internal Medicine

## 2024-06-08 NOTE — Telephone Encounter (Signed)
 OV 05/09/24 Requested Prescriptions  Pending Prescriptions Disp Refills   hydrOXYzine  (ATARAX ) 10 MG tablet [Pharmacy Med Name: hydrOXYzine  HCl 10 MG Oral Tablet] 30 tablet 0    Sig: Take 1 tablet by mouth three times daily as needed     Ear, Nose, and Throat:  Antihistamines 2 Failed - 06/08/2024  1:35 PM      Failed - Cr in normal range and within 360 days    Creat  Date Value Ref Range Status  03/25/2022 1.00 (H) 0.50 - 0.96 mg/dL Final   Creatinine, Ser  Date Value Ref Range Status  05/10/2024 1.02 (H) 0.44 - 1.00 mg/dL Final         Failed - Valid encounter within last 12 months    Recent Outpatient Visits           1 month ago Gross hematuria   [facility] [place], Angeline ORN, [place]

## 2024-06-13 ENCOUNTER — Ambulatory Visit: Payer: Self-pay | Admitting: Urology

## 2024-06-13 VITALS — BP 130/80 | HR 74 | Ht 66.0 in | Wt 145.0 lb

## 2024-06-13 DIAGNOSIS — R310 Gross hematuria: Secondary | ICD-10-CM

## 2024-06-13 LAB — URINALYSIS, COMPLETE
Bilirubin, UA: NEGATIVE
Bilirubin, UA: NEGATIVE
Glucose, UA: NEGATIVE
Glucose, UA: NEGATIVE
Ketones, UA: NEGATIVE
Ketones, UA: NEGATIVE
Leukocytes,UA: NEGATIVE
Leukocytes,UA: NEGATIVE
Nitrite, UA: NEGATIVE
Nitrite, UA: NEGATIVE
Protein,UA: NEGATIVE
Specific Gravity, UA: 1.025 (ref 1.005–1.030)
Specific Gravity, UA: 1.01 (ref 1.005–1.030)
Urobilinogen, Ur: 0.2 mg/dL (ref 0.2–1.0)
Urobilinogen, Ur: 0.2 mg/dL (ref 0.2–1.0)
pH, UA: 6 (ref 5.0–7.5)
pH, UA: 6 (ref 5.0–7.5)

## 2024-06-13 LAB — MICROSCOPIC EXAMINATION
Bacteria, UA: NONE SEEN
Epithelial Cells (non renal): >10 /HPF — AB (ref 0–10)

## 2024-06-13 NOTE — Progress Notes (Signed)
   06/13/24  CC:  Chief Complaint  Patient presents with   Cysto    HPI: No recurrent gross hematuria or voiding symptoms/pain.  UA today microscopy 3-10 RBC/>10 epis  Blood pressure 130/80, pulse 74, height 5' 6 (1.676 m), weight 145 lb (65.8 kg). NED. A&Ox3.   No respiratory distress   Abd soft, NT, ND Normal external genitalia with patent urethral meatus  Cystoscopy Procedure Note  Patient identification was confirmed, informed consent was obtained, and patient was prepped using Betadine solution.  Lidocaine  jelly was administered per urethral meatus.    Procedure: - Flexible cystoscope introduced, without any difficulty.   - Thorough search of the bladder revealed:    normal urethral meatus    normal urothelium    no stones    no ulcers     no tumors    no urethral polyps    no trabeculation  - Ureteral orifices were normal in position and appearance.  Post-Procedure: - Patient tolerated the procedure well  Assessment/ Plan: No lower tract abnormalities Cath urine culture sent for repeat microscopy which showed 3-10 RBC/0-10 epis Recommend UA 1 year either here or with PCP    Sydney JAYSON Barba, MD

## 2024-07-27 ENCOUNTER — Other Ambulatory Visit: Payer: Self-pay | Admitting: Internal Medicine

## 2024-07-28 NOTE — Telephone Encounter (Signed)
 Requested Prescriptions  Pending Prescriptions Disp Refills   hydrOXYzine  (ATARAX ) 10 MG tablet [Pharmacy Med Name: hydrOXYzine  HCl 10 MG Oral Tablet] 90 tablet 0    Sig: Take 1 tablet by mouth three times daily as needed     Ear, Nose, and Throat:  Antihistamines 2 Failed - 07/28/2024  2:11 PM      Failed - Cr in normal range and within 360 days    Creat  Date Value Ref Range Status  03/25/2022 1.00 (H) 0.50 - 0.96 mg/dL Final   Creatinine, Ser  Date Value Ref Range Status  05/10/2024 1.02 (H) 0.44 - 1.00 mg/dL Final         Failed - Valid encounter within last 12 months    Recent Outpatient Visits           2 months ago Gross hematuria   [facility] [place], Angeline ORN, [place]
